# Patient Record
Sex: Female | Born: 1965 | Hispanic: Yes | Marital: Single
Health system: Southern US, Community
[De-identification: ages and names within clinical notes are randomized; demographics above are authoritative.]

## PROBLEM LIST (undated history)

## (undated) DIAGNOSIS — E039 Hypothyroidism, unspecified: Secondary | ICD-10-CM

## (undated) DIAGNOSIS — E119 Type 2 diabetes mellitus without complications: Secondary | ICD-10-CM

## (undated) DIAGNOSIS — I10 Essential (primary) hypertension: Secondary | ICD-10-CM

---

## 2000-04-26 ENCOUNTER — Other Ambulatory Visit: Admission: RE | Admit: 2000-04-26 | Discharge: 2000-04-26 | Payer: Self-pay | Admitting: Obstetrics

## 2000-04-26 ENCOUNTER — Encounter (INDEPENDENT_AMBULATORY_CARE_PROVIDER_SITE_OTHER): Payer: Self-pay | Admitting: Specialist

## 2000-07-19 ENCOUNTER — Encounter: Admission: RE | Admit: 2000-07-19 | Discharge: 2000-07-19 | Payer: Self-pay | Admitting: Obstetrics & Gynecology

## 2000-09-02 ENCOUNTER — Inpatient Hospital Stay (HOSPITAL_COMMUNITY): Admission: AD | Admit: 2000-09-02 | Discharge: 2000-09-02 | Payer: Self-pay | Admitting: *Deleted

## 2000-09-02 ENCOUNTER — Encounter: Payer: Self-pay | Admitting: Obstetrics & Gynecology

## 2000-09-27 ENCOUNTER — Ambulatory Visit (HOSPITAL_COMMUNITY): Admission: RE | Admit: 2000-09-27 | Discharge: 2000-09-27 | Payer: Self-pay | Admitting: *Deleted

## 2000-10-15 ENCOUNTER — Encounter: Admission: RE | Admit: 2000-10-15 | Discharge: 2000-10-15 | Payer: Self-pay | Admitting: Obstetrics & Gynecology

## 2000-12-04 ENCOUNTER — Ambulatory Visit (HOSPITAL_COMMUNITY): Admission: RE | Admit: 2000-12-04 | Discharge: 2000-12-04 | Payer: Self-pay | Admitting: *Deleted

## 2000-12-05 ENCOUNTER — Encounter: Admission: RE | Admit: 2000-12-05 | Discharge: 2000-12-05 | Payer: Self-pay | Admitting: Obstetrics

## 2001-01-20 ENCOUNTER — Ambulatory Visit (HOSPITAL_COMMUNITY): Admission: RE | Admit: 2001-01-20 | Discharge: 2001-01-20 | Payer: Self-pay | Admitting: Obstetrics

## 2001-02-01 ENCOUNTER — Observation Stay (HOSPITAL_COMMUNITY): Admission: AD | Admit: 2001-02-01 | Discharge: 2001-02-02 | Payer: Self-pay | Admitting: Obstetrics

## 2001-02-12 ENCOUNTER — Encounter: Payer: Self-pay | Admitting: Obstetrics

## 2001-02-12 ENCOUNTER — Inpatient Hospital Stay (HOSPITAL_COMMUNITY): Admission: AD | Admit: 2001-02-12 | Discharge: 2001-02-15 | Payer: Self-pay | Admitting: Obstetrics

## 2001-07-08 ENCOUNTER — Encounter: Admission: RE | Admit: 2001-07-08 | Discharge: 2001-07-08 | Payer: Self-pay | Admitting: *Deleted

## 2001-07-08 ENCOUNTER — Other Ambulatory Visit: Admission: RE | Admit: 2001-07-08 | Discharge: 2001-07-08 | Payer: Self-pay | Admitting: *Deleted

## 2001-07-29 ENCOUNTER — Encounter: Admission: RE | Admit: 2001-07-29 | Discharge: 2001-07-29 | Payer: Self-pay | Admitting: *Deleted

## 2002-03-31 ENCOUNTER — Encounter (INDEPENDENT_AMBULATORY_CARE_PROVIDER_SITE_OTHER): Payer: Self-pay | Admitting: Specialist

## 2002-03-31 ENCOUNTER — Encounter: Admission: RE | Admit: 2002-03-31 | Discharge: 2002-03-31 | Payer: Self-pay | Admitting: *Deleted

## 2002-04-06 ENCOUNTER — Ambulatory Visit (HOSPITAL_COMMUNITY): Admission: RE | Admit: 2002-04-06 | Discharge: 2002-04-06 | Payer: Self-pay | Admitting: Radiology

## 2002-08-21 ENCOUNTER — Other Ambulatory Visit: Admission: RE | Admit: 2002-08-21 | Discharge: 2002-08-21 | Payer: Self-pay | Admitting: Family Medicine

## 2002-08-21 ENCOUNTER — Encounter (INDEPENDENT_AMBULATORY_CARE_PROVIDER_SITE_OTHER): Payer: Self-pay | Admitting: Specialist

## 2002-08-21 ENCOUNTER — Encounter: Admission: RE | Admit: 2002-08-21 | Discharge: 2002-08-21 | Payer: Self-pay | Admitting: Family Medicine

## 2002-10-14 ENCOUNTER — Ambulatory Visit: Admission: RE | Admit: 2002-10-14 | Discharge: 2002-10-14 | Payer: Self-pay | Admitting: Gynecology

## 2004-05-09 ENCOUNTER — Ambulatory Visit: Payer: Self-pay | Admitting: Family Medicine

## 2004-05-26 ENCOUNTER — Ambulatory Visit: Payer: Self-pay | Admitting: Family Medicine

## 2004-06-28 DIAGNOSIS — R87619 Unspecified abnormal cytological findings in specimens from cervix uteri: Secondary | ICD-10-CM | POA: Insufficient documentation

## 2004-06-29 ENCOUNTER — Ambulatory Visit: Payer: Self-pay | Admitting: *Deleted

## 2004-06-29 ENCOUNTER — Encounter (INDEPENDENT_AMBULATORY_CARE_PROVIDER_SITE_OTHER): Payer: Self-pay | Admitting: *Deleted

## 2004-06-29 ENCOUNTER — Ambulatory Visit: Payer: Self-pay | Admitting: Family Medicine

## 2004-07-11 ENCOUNTER — Ambulatory Visit (HOSPITAL_COMMUNITY): Admission: RE | Admit: 2004-07-11 | Discharge: 2004-07-11 | Payer: Self-pay | Admitting: Family Medicine

## 2004-07-13 ENCOUNTER — Ambulatory Visit: Payer: Self-pay | Admitting: Family Medicine

## 2004-07-25 ENCOUNTER — Ambulatory Visit: Payer: Self-pay | Admitting: Family Medicine

## 2004-08-11 ENCOUNTER — Ambulatory Visit: Payer: Self-pay | Admitting: Family Medicine

## 2004-08-17 ENCOUNTER — Ambulatory Visit: Payer: Self-pay | Admitting: Family Medicine

## 2004-09-01 ENCOUNTER — Ambulatory Visit: Payer: Self-pay | Admitting: Family Medicine

## 2004-09-04 ENCOUNTER — Ambulatory Visit: Payer: Self-pay | Admitting: Family Medicine

## 2004-09-04 ENCOUNTER — Ambulatory Visit (HOSPITAL_COMMUNITY): Admission: RE | Admit: 2004-09-04 | Discharge: 2004-09-04 | Payer: Self-pay | Admitting: Family Medicine

## 2004-09-04 ENCOUNTER — Encounter (INDEPENDENT_AMBULATORY_CARE_PROVIDER_SITE_OTHER): Payer: Self-pay | Admitting: Specialist

## 2004-09-21 ENCOUNTER — Ambulatory Visit: Payer: Self-pay | Admitting: Obstetrics and Gynecology

## 2004-09-28 ENCOUNTER — Ambulatory Visit: Payer: Self-pay | Admitting: Internal Medicine

## 2004-10-13 ENCOUNTER — Ambulatory Visit: Payer: Self-pay | Admitting: Family Medicine

## 2005-01-12 ENCOUNTER — Ambulatory Visit: Payer: Self-pay | Admitting: Family Medicine

## 2005-01-25 ENCOUNTER — Ambulatory Visit: Payer: Self-pay | Admitting: Obstetrics and Gynecology

## 2005-04-11 ENCOUNTER — Ambulatory Visit: Payer: Self-pay | Admitting: Family Medicine

## 2005-04-18 ENCOUNTER — Ambulatory Visit: Payer: Self-pay | Admitting: Internal Medicine

## 2005-04-23 ENCOUNTER — Ambulatory Visit: Payer: Self-pay | Admitting: Family Medicine

## 2005-08-08 ENCOUNTER — Ambulatory Visit: Payer: Self-pay | Admitting: Family Medicine

## 2005-08-09 ENCOUNTER — Ambulatory Visit (HOSPITAL_COMMUNITY): Admission: RE | Admit: 2005-08-09 | Discharge: 2005-08-09 | Payer: Self-pay | Admitting: Family Medicine

## 2005-08-09 LAB — CONVERTED CEMR LAB
AST: 20 units/L
Albumin: 4.5 g/dL
BUN: 10 mg/dL
Calcium: 9.9 mg/dL
Sodium: 138 meq/L
Total Bilirubin: 0.4 mg/dL
Total Protein: 8.2 g/dL

## 2005-09-05 ENCOUNTER — Encounter (INDEPENDENT_AMBULATORY_CARE_PROVIDER_SITE_OTHER): Payer: Self-pay | Admitting: *Deleted

## 2005-09-05 ENCOUNTER — Ambulatory Visit: Payer: Self-pay | Admitting: Obstetrics and Gynecology

## 2005-11-08 ENCOUNTER — Ambulatory Visit: Payer: Self-pay | Admitting: Family Medicine

## 2005-12-13 ENCOUNTER — Ambulatory Visit: Payer: Self-pay | Admitting: Family Medicine

## 2006-03-06 ENCOUNTER — Encounter (INDEPENDENT_AMBULATORY_CARE_PROVIDER_SITE_OTHER): Payer: Self-pay | Admitting: *Deleted

## 2006-03-06 ENCOUNTER — Ambulatory Visit: Payer: Self-pay | Admitting: Obstetrics and Gynecology

## 2006-04-15 ENCOUNTER — Ambulatory Visit: Payer: Self-pay | Admitting: Internal Medicine

## 2006-05-23 ENCOUNTER — Ambulatory Visit: Payer: Self-pay | Admitting: Internal Medicine

## 2006-06-06 ENCOUNTER — Ambulatory Visit: Payer: Self-pay | Admitting: Family Medicine

## 2006-08-26 ENCOUNTER — Ambulatory Visit: Payer: Self-pay | Admitting: Internal Medicine

## 2006-09-02 ENCOUNTER — Ambulatory Visit (HOSPITAL_COMMUNITY): Admission: RE | Admit: 2006-09-02 | Discharge: 2006-09-02 | Payer: Self-pay | Admitting: Internal Medicine

## 2006-09-23 ENCOUNTER — Ambulatory Visit: Payer: Self-pay | Admitting: Family Medicine

## 2007-02-12 ENCOUNTER — Encounter (INDEPENDENT_AMBULATORY_CARE_PROVIDER_SITE_OTHER): Payer: Self-pay | Admitting: *Deleted

## 2007-03-13 ENCOUNTER — Telehealth (INDEPENDENT_AMBULATORY_CARE_PROVIDER_SITE_OTHER): Payer: Self-pay | Admitting: *Deleted

## 2007-03-27 DIAGNOSIS — I10 Essential (primary) hypertension: Secondary | ICD-10-CM | POA: Insufficient documentation

## 2007-03-27 DIAGNOSIS — F329 Major depressive disorder, single episode, unspecified: Secondary | ICD-10-CM

## 2007-03-27 DIAGNOSIS — F3289 Other specified depressive episodes: Secondary | ICD-10-CM | POA: Insufficient documentation

## 2007-04-09 ENCOUNTER — Ambulatory Visit: Payer: Self-pay | Admitting: Nurse Practitioner

## 2007-04-09 DIAGNOSIS — E785 Hyperlipidemia, unspecified: Secondary | ICD-10-CM

## 2007-04-09 DIAGNOSIS — K219 Gastro-esophageal reflux disease without esophagitis: Secondary | ICD-10-CM

## 2007-04-09 LAB — CONVERTED CEMR LAB
ALT: 14 units/L (ref 0–35)
Alkaline Phosphatase: 68 units/L (ref 39–117)
Basophils Relative: 0 % (ref 0–1)
Creatinine, Ser: 0.59 mg/dL (ref 0.40–1.20)
Glucose, Bld: 78 mg/dL (ref 70–99)
HCT: 42.8 % (ref 36.0–46.0)
Hemoglobin: 14 g/dL (ref 12.0–15.0)
MCHC: 32.7 g/dL (ref 30.0–36.0)
Microalb, Ur: 11.1 mg/dL — ABNORMAL HIGH (ref 0.00–1.89)
Neutrophils Relative %: 50 % (ref 43–77)
Potassium: 4.4 meq/L (ref 3.5–5.3)
RBC: 4.97 M/uL (ref 3.87–5.11)
Total Bilirubin: 0.6 mg/dL (ref 0.3–1.2)
Total Protein: 8.7 g/dL — ABNORMAL HIGH (ref 6.0–8.3)
WBC: 6.6 10*3/uL (ref 4.0–10.5)

## 2007-04-10 ENCOUNTER — Encounter (INDEPENDENT_AMBULATORY_CARE_PROVIDER_SITE_OTHER): Payer: Self-pay | Admitting: Nurse Practitioner

## 2007-04-10 ENCOUNTER — Telehealth (INDEPENDENT_AMBULATORY_CARE_PROVIDER_SITE_OTHER): Payer: Self-pay | Admitting: *Deleted

## 2007-04-10 LAB — CONVERTED CEMR LAB
Free T4: 0.82 ng/dL — ABNORMAL LOW (ref 0.89–1.80)
T3, Free: 3 pg/mL (ref 2.3–4.2)

## 2007-04-14 ENCOUNTER — Encounter (INDEPENDENT_AMBULATORY_CARE_PROVIDER_SITE_OTHER): Payer: Self-pay | Admitting: Nurse Practitioner

## 2007-05-15 ENCOUNTER — Encounter (INDEPENDENT_AMBULATORY_CARE_PROVIDER_SITE_OTHER): Payer: Self-pay | Admitting: *Deleted

## 2007-05-15 ENCOUNTER — Ambulatory Visit: Payer: Self-pay | Admitting: Family Medicine

## 2007-05-15 ENCOUNTER — Encounter (INDEPENDENT_AMBULATORY_CARE_PROVIDER_SITE_OTHER): Payer: Self-pay | Admitting: Nurse Practitioner

## 2007-05-15 DIAGNOSIS — E039 Hypothyroidism, unspecified: Secondary | ICD-10-CM | POA: Insufficient documentation

## 2007-05-15 LAB — CONVERTED CEMR LAB
Cholesterol: 180 mg/dL (ref 0–200)
HDL: 45 mg/dL (ref >39)
LDL Cholesterol: 97 mg/dL (ref 0–99)
VLDL: 38 mg/dL (ref 0–40)

## 2007-05-16 ENCOUNTER — Encounter (INDEPENDENT_AMBULATORY_CARE_PROVIDER_SITE_OTHER): Payer: Self-pay | Admitting: Nurse Practitioner

## 2007-06-26 ENCOUNTER — Encounter (INDEPENDENT_AMBULATORY_CARE_PROVIDER_SITE_OTHER): Payer: Self-pay | Admitting: Nurse Practitioner

## 2007-06-26 ENCOUNTER — Ambulatory Visit: Payer: Self-pay | Admitting: Family Medicine

## 2007-06-26 LAB — CONVERTED CEMR LAB: TSH: 3.238 microintl units/mL (ref 0.350–5.50)

## 2007-06-27 ENCOUNTER — Encounter (INDEPENDENT_AMBULATORY_CARE_PROVIDER_SITE_OTHER): Payer: Self-pay | Admitting: Nurse Practitioner

## 2007-07-22 ENCOUNTER — Encounter (INDEPENDENT_AMBULATORY_CARE_PROVIDER_SITE_OTHER): Payer: Self-pay | Admitting: Family Medicine

## 2007-07-22 ENCOUNTER — Ambulatory Visit: Payer: Self-pay | Admitting: Family Medicine

## 2007-07-22 DIAGNOSIS — J069 Acute upper respiratory infection, unspecified: Secondary | ICD-10-CM | POA: Insufficient documentation

## 2007-07-22 LAB — CONVERTED CEMR LAB
Specific Gravity, Urine: <1.005
Urobilinogen, UA: 0.2
pH: 6.5

## 2007-07-23 ENCOUNTER — Encounter (INDEPENDENT_AMBULATORY_CARE_PROVIDER_SITE_OTHER): Payer: Self-pay | Admitting: Family Medicine

## 2007-07-23 LAB — CONVERTED CEMR LAB
Chlamydia, DNA Probe: NEGATIVE
GC Probe Amp, Genital: NEGATIVE

## 2007-09-07 IMAGING — MG MM DIGITAL SCREENING BILAT W/ CAD
5 series · 5 of 5 positions shown · non-contrast
Comparison: none

SCREENING MAMMOGRAM:
There is a  dense fibroglandular pattern.  No masses or malignant type calcifications are 
identified.  Compared with prior studies.

[R CC]
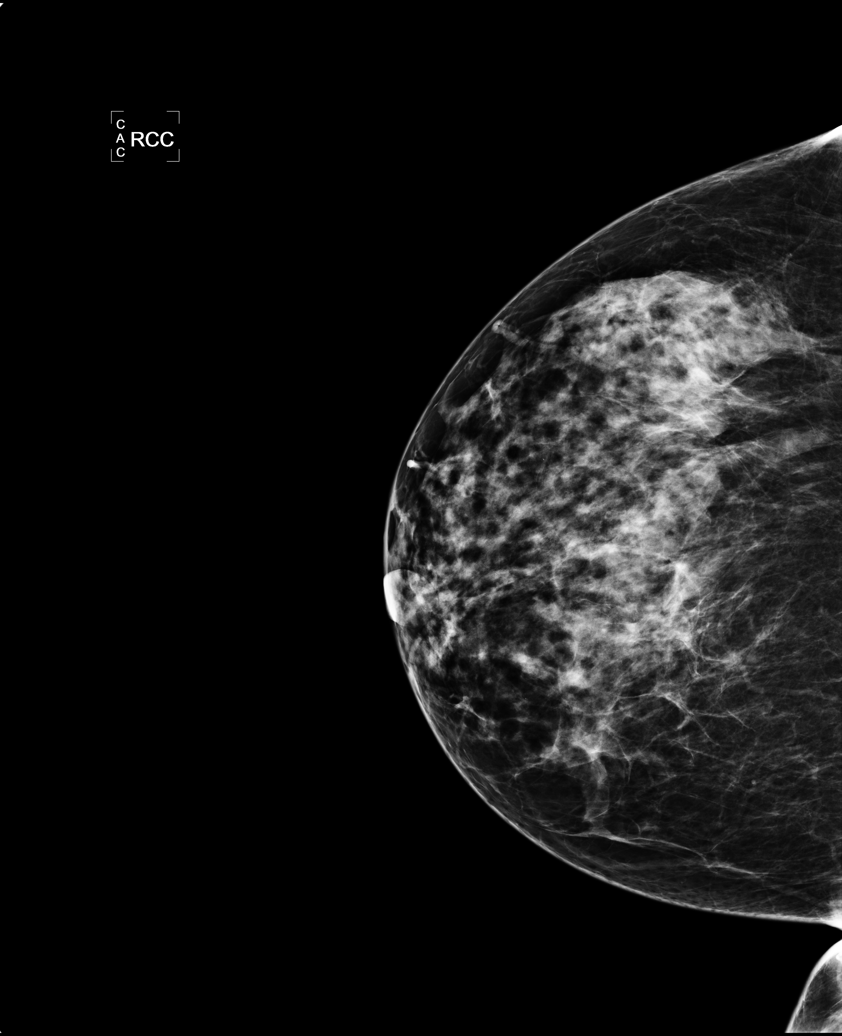

[R MLO]
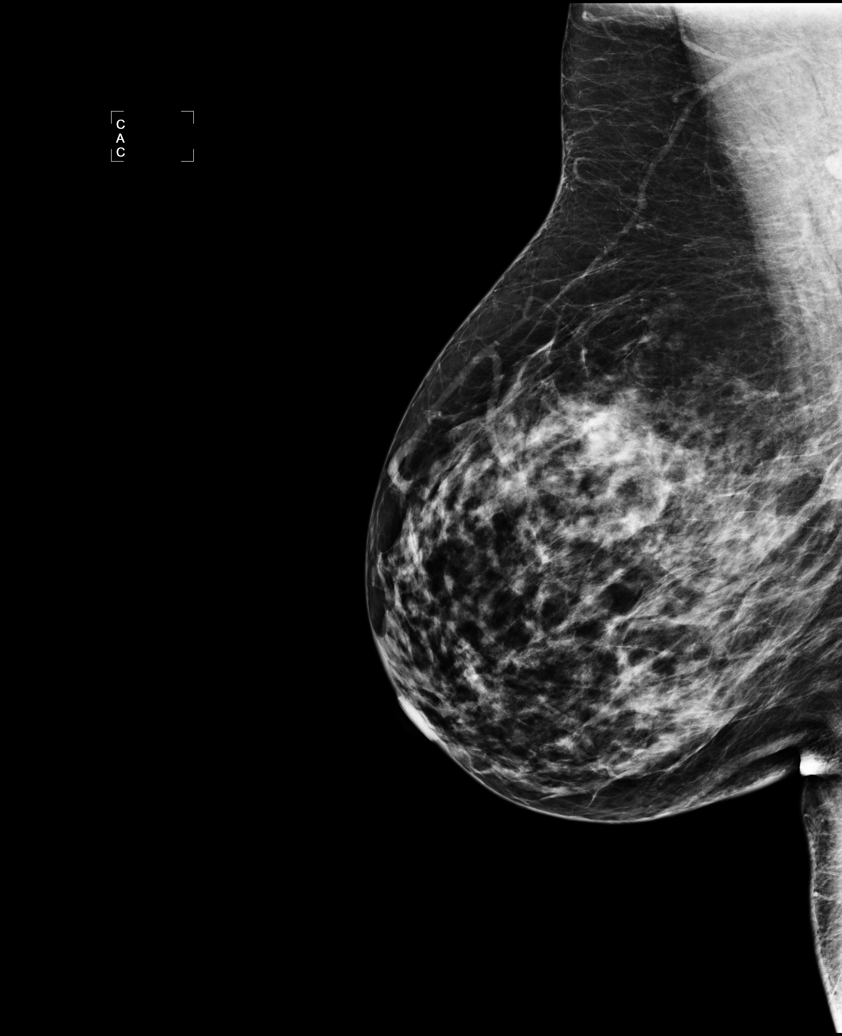

[L CC]
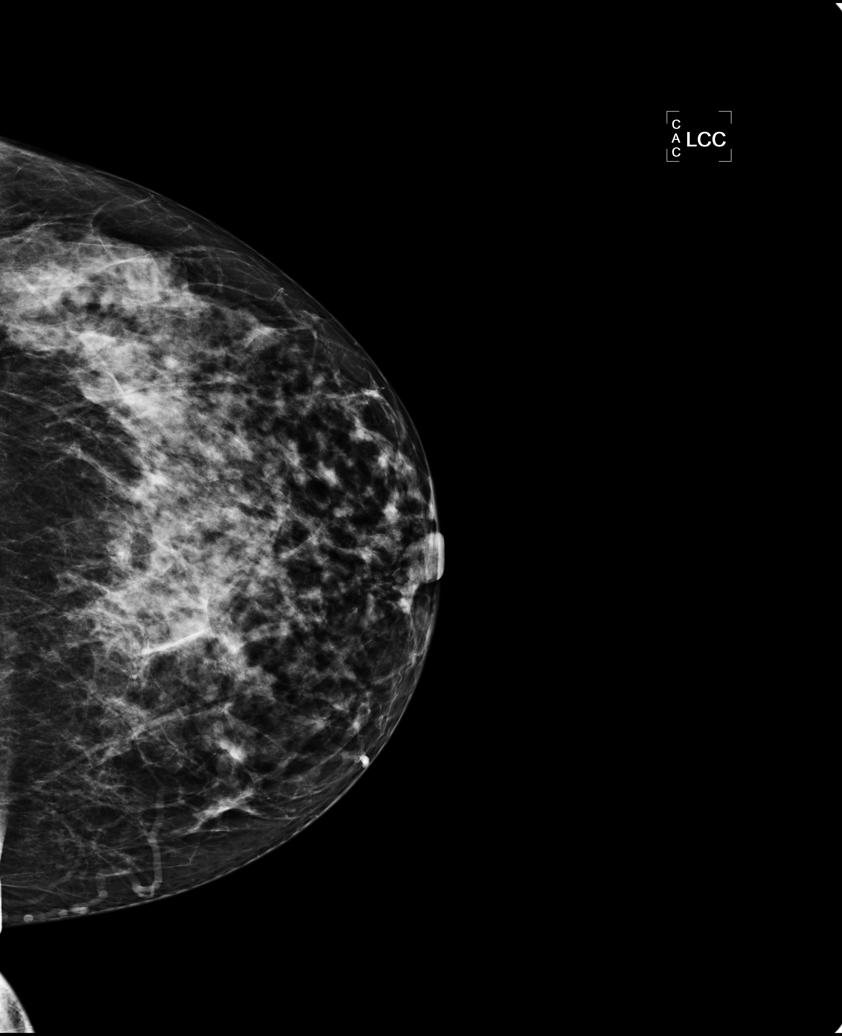

[L MLO (1 of 2)]
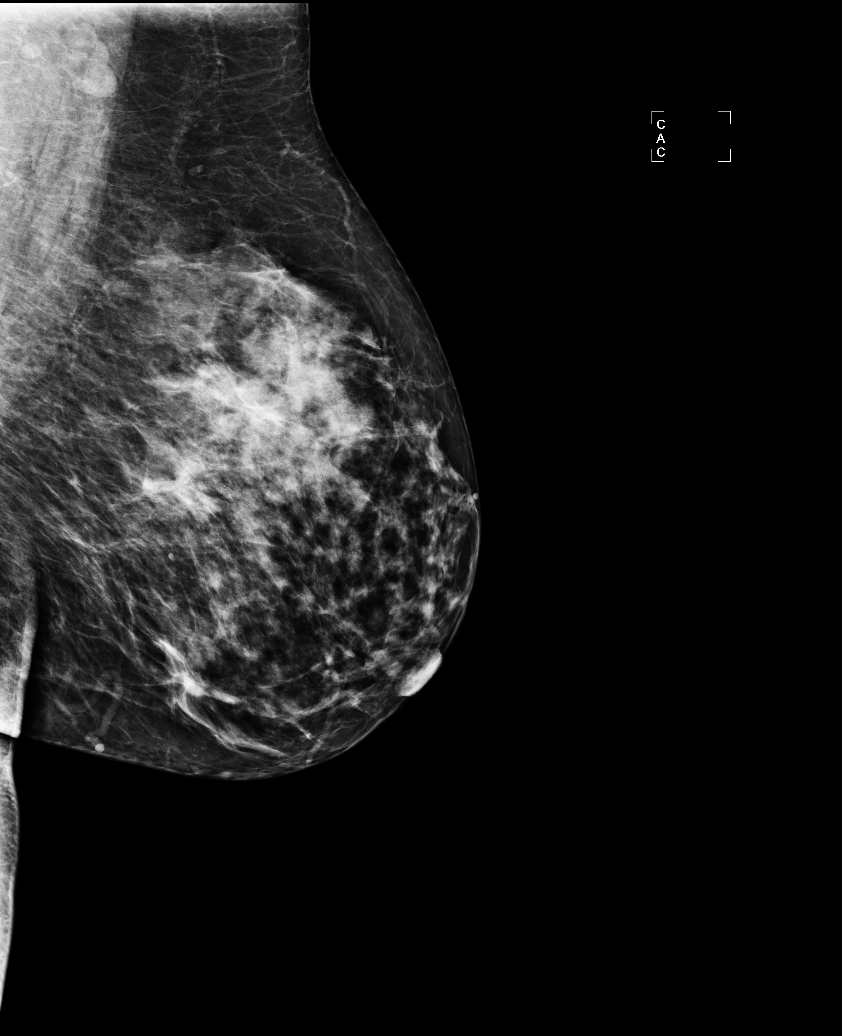

[L MLO (2 of 2)]
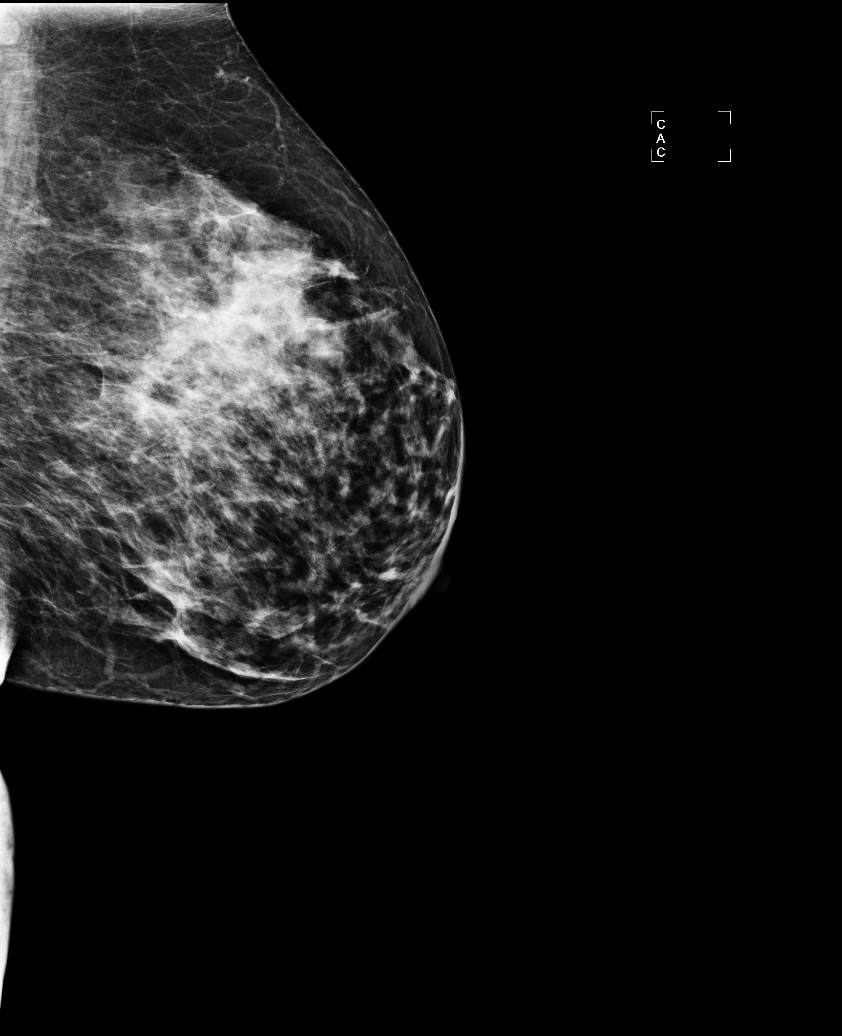

[5 of 5 positions shown; findings below may reference images not displayed]

IMPRESSION: No specific mammographic evidence of malignancy.  Next screening mammogram is recommended in one 
year.

ASSESSMENT: Negative - BI-RADS 1

Screening mammogram in 1 year.

## 2007-10-06 ENCOUNTER — Telehealth (INDEPENDENT_AMBULATORY_CARE_PROVIDER_SITE_OTHER): Payer: Self-pay | Admitting: *Deleted

## 2007-10-29 ENCOUNTER — Ambulatory Visit: Payer: Self-pay | Admitting: Family Medicine

## 2007-10-29 DIAGNOSIS — B353 Tinea pedis: Secondary | ICD-10-CM | POA: Insufficient documentation

## 2008-03-03 ENCOUNTER — Ambulatory Visit: Payer: Self-pay | Admitting: Family Medicine

## 2008-03-03 DIAGNOSIS — R05 Cough: Secondary | ICD-10-CM

## 2008-03-03 LAB — CONVERTED CEMR LAB
Ketones, urine, test strip: NEGATIVE
Nitrite: NEGATIVE
Specific Gravity, Urine: 1.015
TSH: 4.036 microintl units/mL (ref 0.350–4.50)

## 2008-04-08 ENCOUNTER — Telehealth (INDEPENDENT_AMBULATORY_CARE_PROVIDER_SITE_OTHER): Payer: Self-pay | Admitting: *Deleted

## 2008-07-28 ENCOUNTER — Ambulatory Visit: Payer: Self-pay | Admitting: Family Medicine

## 2008-07-28 DIAGNOSIS — IMO0002 Reserved for concepts with insufficient information to code with codable children: Secondary | ICD-10-CM

## 2008-07-28 DIAGNOSIS — H60339 Swimmer's ear, unspecified ear: Secondary | ICD-10-CM

## 2008-08-06 ENCOUNTER — Ambulatory Visit (HOSPITAL_COMMUNITY): Admission: RE | Admit: 2008-08-06 | Discharge: 2008-08-06 | Payer: Self-pay | Admitting: Family Medicine

## 2008-09-06 ENCOUNTER — Ambulatory Visit: Payer: Self-pay | Admitting: Family Medicine

## 2008-09-06 ENCOUNTER — Encounter (INDEPENDENT_AMBULATORY_CARE_PROVIDER_SITE_OTHER): Payer: Self-pay | Admitting: Family Medicine

## 2008-09-06 DIAGNOSIS — J301 Allergic rhinitis due to pollen: Secondary | ICD-10-CM | POA: Insufficient documentation

## 2008-09-06 LAB — CONVERTED CEMR LAB
ALT: 13 units/L (ref 0–35)
AST: 14 units/L (ref 0–37)
Albumin: 4.1 g/dL (ref 3.5–5.2)
CO2: 19 meq/L (ref 19–32)
Calcium: 9 mg/dL (ref 8.4–10.5)
Chloride: 106 meq/L (ref 96–112)
Creatinine, Ser: 0.53 mg/dL (ref 0.40–1.20)
GC Probe Amp, Genital: NEGATIVE
Glucose, Bld: 76 mg/dL (ref 70–99)
Glucose, Urine, Semiquant: NEGATIVE
Ketones, urine, test strip: NEGATIVE
Protein, U semiquant: 30
Total Bilirubin: 0.3 mg/dL (ref 0.3–1.2)
pH: 5.5

## 2008-09-13 ENCOUNTER — Ambulatory Visit: Payer: Self-pay | Admitting: Family Medicine

## 2008-09-13 LAB — CONVERTED CEMR LAB
Cholesterol: 187 mg/dL (ref 0–200)
LDL Cholesterol: 112 mg/dL — ABNORMAL HIGH (ref 0–99)
Triglycerides: 164 mg/dL — ABNORMAL HIGH (ref <150)
VLDL: 33 mg/dL (ref 0–40)

## 2008-10-21 ENCOUNTER — Emergency Department (HOSPITAL_COMMUNITY): Admission: EM | Admit: 2008-10-21 | Discharge: 2008-10-22 | Payer: Self-pay | Admitting: Emergency Medicine

## 2008-10-27 ENCOUNTER — Encounter (INDEPENDENT_AMBULATORY_CARE_PROVIDER_SITE_OTHER): Payer: Self-pay | Admitting: Family Medicine

## 2008-11-11 ENCOUNTER — Ambulatory Visit: Payer: Self-pay | Admitting: Nurse Practitioner

## 2008-11-11 DIAGNOSIS — R350 Frequency of micturition: Secondary | ICD-10-CM

## 2008-11-11 DIAGNOSIS — R3129 Other microscopic hematuria: Secondary | ICD-10-CM

## 2008-11-11 LAB — CONVERTED CEMR LAB
Beta hcg, urine, semiquantitative: NEGATIVE
Blood Glucose, Fingerstick: 81
Glucose, Urine, Semiquant: NEGATIVE
Ketones, urine, test strip: NEGATIVE
WBC Urine, dipstick: NEGATIVE

## 2008-11-12 ENCOUNTER — Encounter (INDEPENDENT_AMBULATORY_CARE_PROVIDER_SITE_OTHER): Payer: Self-pay | Admitting: Nurse Practitioner

## 2008-11-23 ENCOUNTER — Telehealth (INDEPENDENT_AMBULATORY_CARE_PROVIDER_SITE_OTHER): Payer: Self-pay | Admitting: Family Medicine

## 2009-01-05 ENCOUNTER — Ambulatory Visit: Payer: Self-pay | Admitting: Physician Assistant

## 2009-01-05 DIAGNOSIS — L293 Anogenital pruritus, unspecified: Secondary | ICD-10-CM | POA: Insufficient documentation

## 2009-01-05 LAB — CONVERTED CEMR LAB: Chlamydia, DNA Probe: NEGATIVE

## 2009-01-06 ENCOUNTER — Encounter: Payer: Self-pay | Admitting: Physician Assistant

## 2009-01-10 ENCOUNTER — Telehealth: Payer: Self-pay | Admitting: Physician Assistant

## 2009-01-20 ENCOUNTER — Telehealth: Payer: Self-pay | Admitting: Physician Assistant

## 2009-02-01 ENCOUNTER — Telehealth: Payer: Self-pay | Admitting: Physician Assistant

## 2009-02-07 ENCOUNTER — Ambulatory Visit: Payer: Self-pay | Admitting: Physician Assistant

## 2009-02-07 DIAGNOSIS — M722 Plantar fascial fibromatosis: Secondary | ICD-10-CM

## 2009-03-31 ENCOUNTER — Ambulatory Visit: Payer: Self-pay | Admitting: Physician Assistant

## 2009-03-31 DIAGNOSIS — N912 Amenorrhea, unspecified: Secondary | ICD-10-CM

## 2009-03-31 LAB — CONVERTED CEMR LAB
BUN: 11 mg/dL (ref 6–23)
Basophils Absolute: 0 10*3/uL (ref 0.0–0.1)
Basophils Relative: 0 % (ref 0–1)
CO2: 22 meq/L (ref 19–32)
Creatinine, Ser: 0.59 mg/dL (ref 0.40–1.20)
Glucose, Bld: 81 mg/dL (ref 70–99)
HCT: 41.7 % (ref 36.0–46.0)
Hemoglobin: 13.9 g/dL (ref 12.0–15.0)
Lymphocytes Relative: 38 % (ref 12–46)
MCV: 86.5 fL (ref 78.0–100.0)
Neutrophils Relative %: 55 % (ref 43–77)
Platelets: 346 10*3/uL (ref 150–400)
Sodium: 137 meq/L (ref 135–145)
WBC: 6.5 10*3/uL (ref 4.0–10.5)

## 2009-04-01 ENCOUNTER — Encounter: Payer: Self-pay | Admitting: Physician Assistant

## 2009-04-08 ENCOUNTER — Encounter: Payer: Self-pay | Admitting: Physician Assistant

## 2009-04-29 ENCOUNTER — Ambulatory Visit: Payer: Self-pay | Admitting: Physician Assistant

## 2009-04-29 ENCOUNTER — Telehealth (INDEPENDENT_AMBULATORY_CARE_PROVIDER_SITE_OTHER): Payer: Self-pay | Admitting: Internal Medicine

## 2009-04-29 ENCOUNTER — Ambulatory Visit: Payer: Self-pay | Admitting: Internal Medicine

## 2009-04-29 DIAGNOSIS — J029 Acute pharyngitis, unspecified: Secondary | ICD-10-CM

## 2009-05-31 LAB — CONVERTED CEMR LAB

## 2009-07-01 ENCOUNTER — Ambulatory Visit: Payer: Self-pay | Admitting: Physician Assistant

## 2009-07-04 LAB — CONVERTED CEMR LAB
Eosinophils Absolute: 0 10*3/uL (ref 0.0–0.7)
Eosinophils Relative: 1 % (ref 0–5)
Hemoglobin: 13 g/dL (ref 12.0–15.0)
Lymphocytes Relative: 37 % (ref 12–46)
Lymphs Abs: 2.9 10*3/uL (ref 0.7–4.0)
MCHC: 32.8 g/dL (ref 30.0–36.0)
MCV: 86.5 fL (ref 78.0–100.0)
Monocytes Absolute: 0.5 10*3/uL (ref 0.1–1.0)
Monocytes Relative: 6 % (ref 3–12)
Neutro Abs: 4.3 10*3/uL (ref 1.7–7.7)
Neutrophils Relative %: 56 % (ref 43–77)
Platelets: 329 10*3/uL (ref 150–400)

## 2009-07-25 ENCOUNTER — Ambulatory Visit: Payer: Self-pay | Admitting: Nurse Practitioner

## 2009-07-25 LAB — CONVERTED CEMR LAB: Rapid Strep: NEGATIVE

## 2009-08-12 ENCOUNTER — Ambulatory Visit: Payer: Self-pay | Admitting: Physician Assistant

## 2009-08-12 LAB — CONVERTED CEMR LAB
Calcium: 9 mg/dL (ref 8.4–10.5)
Chloride: 101 meq/L (ref 96–112)
Glucose, Bld: 81 mg/dL (ref 70–99)

## 2009-08-13 ENCOUNTER — Encounter: Payer: Self-pay | Admitting: Physician Assistant

## 2009-08-19 ENCOUNTER — Ambulatory Visit (HOSPITAL_COMMUNITY): Admission: RE | Admit: 2009-08-19 | Discharge: 2009-08-19 | Payer: Self-pay | Admitting: Internal Medicine

## 2009-10-14 ENCOUNTER — Ambulatory Visit: Payer: Self-pay | Admitting: Physician Assistant

## 2009-10-14 DIAGNOSIS — K120 Recurrent oral aphthae: Secondary | ICD-10-CM

## 2009-10-14 DIAGNOSIS — R82998 Other abnormal findings in urine: Secondary | ICD-10-CM

## 2009-10-14 LAB — CONVERTED CEMR LAB
Bilirubin Urine: NEGATIVE
Glucose, Urine, Semiquant: NEGATIVE
Nitrite: NEGATIVE
Pap Smear: NEGATIVE
Urobilinogen, UA: 0.2

## 2009-10-16 LAB — CONVERTED CEMR LAB
ALT: 14 units/L (ref 0–35)
AST: 14 units/L (ref 0–37)
Albumin: 4.5 g/dL (ref 3.5–5.2)
Bacteria, UA: NONE SEEN
Chlamydia, DNA Probe: NEGATIVE
Chloride: 103 meq/L (ref 96–112)
GC Probe Amp, Genital: NEGATIVE
Glucose, Bld: 95 mg/dL (ref 70–99)
Potassium: 4.2 meq/L (ref 3.5–5.3)
RBC / HPF: NONE SEEN (ref <3)
Total Bilirubin: 0.5 mg/dL (ref 0.3–1.2)
VLDL: 30 mg/dL (ref 0–40)
WBC, UA: NONE SEEN cells/hpf (ref <3)

## 2009-10-18 ENCOUNTER — Encounter: Payer: Self-pay | Admitting: Physician Assistant

## 2009-10-25 ENCOUNTER — Ambulatory Visit: Payer: Self-pay | Admitting: Physician Assistant

## 2009-10-31 ENCOUNTER — Emergency Department (HOSPITAL_COMMUNITY): Admission: EM | Admit: 2009-10-31 | Discharge: 2009-10-31 | Payer: Self-pay | Admitting: Emergency Medicine

## 2009-11-01 ENCOUNTER — Telehealth (INDEPENDENT_AMBULATORY_CARE_PROVIDER_SITE_OTHER): Payer: Self-pay | Admitting: *Deleted

## 2010-02-10 ENCOUNTER — Ambulatory Visit: Payer: Self-pay | Admitting: Internal Medicine

## 2010-02-10 DIAGNOSIS — H6980 Other specified disorders of Eustachian tube, unspecified ear: Secondary | ICD-10-CM

## 2010-02-10 DIAGNOSIS — H6990 Unspecified Eustachian tube disorder, unspecified ear: Secondary | ICD-10-CM | POA: Insufficient documentation

## 2010-02-14 ENCOUNTER — Ambulatory Visit: Payer: Self-pay | Admitting: Physician Assistant

## 2010-02-14 DIAGNOSIS — R300 Dysuria: Secondary | ICD-10-CM | POA: Insufficient documentation

## 2010-02-14 DIAGNOSIS — N920 Excessive and frequent menstruation with regular cycle: Secondary | ICD-10-CM

## 2010-02-14 LAB — CONVERTED CEMR LAB
Ketones, urine, test strip: NEGATIVE
Nitrite: NEGATIVE
Protein, U semiquant: NEGATIVE
Urobilinogen, UA: 0.2
WBC Urine, dipstick: NEGATIVE

## 2010-02-15 ENCOUNTER — Encounter: Payer: Self-pay | Admitting: Physician Assistant

## 2010-02-16 LAB — CONVERTED CEMR LAB
GC Probe Amp, Genital: NEGATIVE
Squamous Epithelial / LPF: NONE SEEN /lpf
TSH: 2.881 microintl units/mL (ref 0.350–4.500)

## 2010-03-10 ENCOUNTER — Ambulatory Visit: Payer: Self-pay | Admitting: Physician Assistant

## 2010-03-10 DIAGNOSIS — E559 Vitamin D deficiency, unspecified: Secondary | ICD-10-CM | POA: Insufficient documentation

## 2010-03-13 LAB — CONVERTED CEMR LAB: Vit D, 25-Hydroxy: 25 ng/mL — ABNORMAL LOW (ref 30–89)

## 2010-04-06 ENCOUNTER — Ambulatory Visit: Payer: Self-pay | Admitting: Internal Medicine

## 2010-04-06 DIAGNOSIS — L0292 Furuncle, unspecified: Secondary | ICD-10-CM | POA: Insufficient documentation

## 2010-04-06 DIAGNOSIS — L0293 Carbuncle, unspecified: Secondary | ICD-10-CM

## 2010-06-02 ENCOUNTER — Ambulatory Visit
Admission: RE | Admit: 2010-06-02 | Discharge: 2010-06-02 | Payer: Self-pay | Source: Home / Self Care | Attending: Internal Medicine | Admitting: Internal Medicine

## 2010-06-02 DIAGNOSIS — G5600 Carpal tunnel syndrome, unspecified upper limb: Secondary | ICD-10-CM | POA: Insufficient documentation

## 2010-06-02 DIAGNOSIS — R071 Chest pain on breathing: Secondary | ICD-10-CM | POA: Insufficient documentation

## 2010-06-18 ENCOUNTER — Encounter: Payer: Self-pay | Admitting: Family Medicine

## 2010-06-27 NOTE — Assessment & Plan Note (Signed)
Summary: CPP EXAM///GK   Vital Signs:  Patient profile:   45 year old female Height:      61.5 inches Weight:      197 pounds BMI:     36.75 Temp:     97.6 degrees F oral Pulse rate:   64 / minute Pulse rhythm:   regular Resp:     18 per minute BP sitting:   127 / 78  (left arm) Cuff size:   large  Vitals Entered By: [place] Shannon (Oct 14, 2009 9:31 AM) CC: cpp... pt is complain of sore throat...  Does patient need assistance? Functional Status Self care Ambulation Normal   Primary Care Provider:  Tereso Newcomer PA-C  CC:  cpp... pt is complain of sore throat....  History of Present Illness: Here for CPP.  Health maint: LMP 5.3.2011 + h/o abnormal pap smear with conization No h/o abnormal bleeding or discharge or odor. Mammo done this year. No FHx of breast cancer. Not taking calcium. PHQ9=8 today.  Used to take antidepressants in past.  Not interested in taking again.  Would like to do counseling.  Sore throat: Notes for 3 weeks.  Has not taken anything for it.  Always hurts.  Nothing makes worse.  Nothing makes better.  No fevers.  No chills.  No cough.  No head congestion or cold symptoms.  Does think she feels postnasal drip.  GERD:  Doing much better.  Denies stomach pain.  No dysphagia.  No melena or hematochezia.  No belching or indigestion.   Problems Prior to Update: 1)  Aphthous Ulcers  (ICD-528.2) 2)  Urinalysis, Abnormal  (ICD-791.9) 3)  Sore Throat  (ICD-462) 4)  Amenorrhea  (ICD-626.0) 5)  Plantar Fasciitis, Right  (ICD-728.71) 6)  Vaginal Pruritus  (ICD-698.1) 7)  Frequency, Urinary  (ICD-788.41) 8)  Microscopic Hematuria  (ICD-599.72) 9)  Allergic Rhinitis, Seasonal  (ICD-477.0) 10)  Screening For Malignant Neoplasm, Cervix  (ICD-V76.2) 11)  Muscle Strain, Right Pectoralis Muscle  (ICD-848.8) 12)  Screening For Mlig Neop, Breast, Nos  (ICD-V76.10) 13)  Otitis Externa, Acute, Right  (ICD-380.12) 14)  Cough  (ICD-786.2) 15)  Tinea Pedis   (ICD-110.4) 16)  Uri  (ICD-465.9) 17)  Hyperlipidemia  (ICD-272.4) 18)  Screening For Malignant Neoplasm, Cervix  (ICD-V76.2) 19)  Examination, Routine Medical  (ICD-V70.0) 20)  Contraceptive Management  (ICD-V25.09) 21)  Hypothyroidism  (ICD-244.9) 22)  Pap Smear, Abnormal  (ICD-795.00) 23)  Dyslipidemia  (ICD-272.4) 24)  Gerd  (ICD-530.81) 25)  Depression  (ICD-311) 26)  Hypertension  (ICD-401.9)  Allergies: No Known Drug Allergies  Past History:  Past Surgical History: Last updated: 07/22/2007 h/o abnormal PAP and cervical conization 09/2004  Past Medical History: Depression Hyperlipidemia Hypertension Hypothyroidism h/o abnormal pap with conization 2008  Family History: Reviewed history from 07/22/2007 and no changes required. Mother died age 14 Fire. Had HTN,DM,high cholesterol. Father living DM  No breast/ovarian/uterine/cervical/or colon cancer.  Social History: Occupation:unemployed Has one partnerx8 years. Never Smoked Alcohol use-no Drug use-no Married  Review of Systems      See HPI General:  Denies chills and fever. ENT:  See HPI. CV:  Denies chest pain or discomfort and shortness of breath with exertion. Resp:  Denies cough. GI:  Denies bloody stools and dark tarry stools. GU:  Denies hematuria. MS:  "my bones hurt". Psych:  Denies suicidal thoughts/plans.  Physical Exam  General:  alert, well-developed, and well-nourished.   Head:  normocephalic and atraumatic.   Eyes:  pupils equal, pupils  round, pupils reactive to light, and no optic disk abnormalities.   Ears:  R ear normal and L ear normal.   Nose:  no external deformity.   Mouth:  post pharynx pink and moist small aphthous ulcer noted on right ant tonsillar pillar no exudate no tonsillar enlargement Neck:  supple, no thyromegaly, no carotid bruits, and no cervical lymphadenopathy.   Breasts:  skin/areolae normal, no masses, no abnormal thickening, no nipple discharge, no tenderness,  and no adenopathy.   Lungs:  normal breath sounds, no crackles, and no wheezes.   Heart:  normal rate and regular rhythm.   Abdomen:  soft, non-tender, normal bowel sounds, and no hepatomegaly.   Rectal:  external hemorrhoid(s).   Genitalia:  normal introitus, no external lesions, no vaginal discharge, mucosa pink and moist, no vaginal or cervical lesions, no vaginal atrophy, and no friaility or hemorrhage.   fundus and adnexae difficult to palpate with body habitus of note, cervical os very small . . . ? related to conization . Msk:  normal ROM.   Pulses:  R posterior tibial normal, R dorsalis pedis normal, L posterior tibial normal, and L dorsalis pedis normal.   Extremities:  no edema  Neurologic:  alert & oriented X3, cranial nerves II-XII intact, and DTRs symmetrical and normal.   Skin:  turgor normal.   Psych:  normally interactive and good eye contact.     Impression & Recommendations:  Problem # 1:  DEPRESSION (ICD-311)  PHQ9=8 does not want meds. . . not sure she needs no suicidal thoughts would like to see LCSW refer to counseling  Orders: Psychology Referral (Psychology)  Problem # 2:  GERD (ICD-530.81) controlled advised her to stop carafate once she runs out of next prescription will need f/u with me if GERD recurs and consider GI referral at that time  Her updated medication list for this problem includes:    Protonix 40 Mg Tbec (Pantoprazole sodium) .Marland Kitchen... Take 1 tablet by mouth two times a day for stomach.  do not run out or your symptoms will get worse (write in spanish)    Carafate 1 Gm Tabs (Sucralfate) ..... One tablet by mouth three times a day before meals for stomach  Problem # 3:  APHTHOUS ULCERS (ICD-528.2) Rx magic mouthwash  Problem # 4:  URINALYSIS, ABNORMAL (ICD-791.9) check microscopic  Orders: T- * Misc. Laboratory test ([phone])  Problem # 5:  HYPERLIPIDEMIA (ICD-272.4)  Orders: T-Comprehensive Metabolic Panel ([id number])  Problem #  6:  EXAMINATION, ROUTINE MEDICAL (ICD-V70.0) if pap is inadequate, refer to GYN clinic for repeat in 6 mos  Orders: KOH/ WET [place] [phone]) UA Dipstick w/o Micro (manual) (29562) T-HIV Antibody  (Reflex) [phone]) T-Pap Smear, Thin Prep (96295) T- GC Chlamydia (28413) T-Vitamin D (25-Hydroxy) (24401-02725)  Problem # 7:  HYPERTENSION (ICD-401.9) controlled  Her updated medication list for this problem includes:    Benazepril Hcl 10 Mg Tabs (Benazepril hcl) .Marland Kitchen... Take 1 tablet by mouth once a day  Orders: UA Dipstick w/o Micro (manual) (36644) EKG w/ Interpretation (93000) T-Comprehensive Metabolic Panel ([id number])  Complete Medication List: 1)  Benazepril Hcl 10 Mg Tabs (Benazepril hcl) .... Take 1 tablet by mouth once a day 2)  Levothyroxine Sodium 25 Mcg Tabs (Levothyroxine sodium) .Marland Kitchen.. 1 tablet by mouth daily for thyroid 3)  Protonix 40 Mg Tbec (Pantoprazole sodium) .... Take 1 tablet by mouth two times a day for stomach.  do not run out or your symptoms will get worse (write in  spanish) 4)  Naprosyn 500 Mg Tabs (Naproxen) .... Take 1 tablet by mouth two times a day with food for one week, then take two times a day as needed for pain (please write in spanish) 5)  Proventil Hfa 108 (90 Base) Mcg/act Aers (Albuterol sulfate) .Marland Kitchen.. 1-2 puffs q 4-6 hours as needed cough, wheezing (please write in spanish) and educate on use of inhaler 6)  Ibuprofen 800 Mg Tabs (Ibuprofen) .Marland Kitchen.. 1 tab by mouth every 6 hours as needed for sore throat pain--take with food 7)  Carafate 1 Gm Tabs (Sucralfate) .... One tablet by mouth three times a day before meals for stomach 8)  Magic Mouthwash  .... (1 part viscous lidocaine, 1 part maalox, 1 part benadryl) swish, gargle and spit 5 ml every 6-8 hours as needed for throat pain  Other Orders: T-Lipid Profile ([id number])  Patient Instructions: 1)  Schedule appointment with Ethelene Browns. 2)  Take Calcium 600 mg + Vitamin D 400 International Units  two times a day. 3)  When you finish the next prescription of the Carafate (Sucrulfate), do not refill.  Make a follow up appointment if your symptoms return. 4)  Use the Magic Mouthwash every 6-8 hours as needed.  Do not swallow. 5)  Please schedule a follow-up appointment in 4 months with Issachar Broady for blood pressure.  Prescriptions: MAGIC MOUTHWASH (1 part viscous lidocaine, 1 part maalox, 1 part benadryl) Swish, gargle and spit 5 mL every 6-8 hours as needed for throat pain  #100 mL x 0   Entered and Authorized by:   Tereso Newcomer PA-C   Signed by:   Tereso Newcomer PA-C on 10/14/2009   Method used:   Print then Give to Patient   RxID:   [id number]   Laboratory Results   Urine Tests    Routine Urinalysis   Glucose: negative   (Normal Range: Negative) Bilirubin: negative   (Normal Range: Negative) Ketone: negative   (Normal Range: Negative) Spec. Gravity: <1.005   (Normal Range: 1.003-1.035) Blood: trace-lysed   (Normal Range: Negative) pH: 5.5   (Normal Range: 5.0-8.0) Protein: negative   (Normal Range: Negative) Urobilinogen: 0.2   (Normal Range: 0-1) Nitrite: negative   (Normal Range: Negative) Leukocyte Esterace: negative   (Normal Range: Negative)

## 2010-06-27 NOTE — Letter (Signed)
Summary: Handout Printed  Printed Handout:  - Gastroesophageal Reflux Disease (GERD)

## 2010-06-27 NOTE — Assessment & Plan Note (Signed)
Summary: Acute - GERD   Vital Signs:  Patient profile:   45 year old female LMP:     07/13/2009 Weight:      198.8 pounds BMI:     37.09 BSA:     1.90 Temp:     97.4 degrees F oral Pulse rate:   59 / minute Pulse rhythm:   regular Resp:     16 per minute BP sitting:   121 / 83  (left arm) Cuff size:   large  Vitals Entered By: Levon Hedger (July 25, 2009 8:40 AM) CC: the last time she was here she got medication for her stomach and she is still having the same stomach pain..pt states she did not eat today and she feels burning in her stomach, Abdominal Pain Is Patient Diabetic? No Pain Assessment Patient in pain? yes     Location: stomach  Does patient need assistance? Functional Status Self care Ambulation Normal LMP (date): 07/13/2009 LMP - Character: normal     Enter LMP: 07/13/2009 Last PAP Result ENDOMETRIAL CELLS PRESENT.  CLINICAL CORRELATION   Primary Care Provider:  Tereso Newcomer PA-C  CC:  the last time she was here she got medication for her stomach and she is still having the same stomach pain..pt states she did not eat today and she feels burning in her stomach and Abdominal Pain.  History of Present Illness:  Pt into the office for f/u on stomach Pt was last seen by Wende Mott for stomach problems - GERD Pt was started on protonix and increased to two times a day and symptoms have NOT mproved.  Pt reports that she was not advised on what foods to avoid - record indicates otherwise. Recently symptoms exacerbated after eating chili.    No tobacco  No ETOH Drinks Coca-Coca but no coffee Admits to eating spicy foods (ongoing) Naprosyn and Ibuprofen on pt's med list - states she has had this medication in the past due to leg pain but she is not consecutively taking Decreased appetite due to painful swallowing.  Pt is requesting "strep test"  Advised pt that symptoms are likely from reflux but will check today so that she will be confident with GERD  Dx  Spanish interpreter present with pt today Daughter also present with pt    Dyspepsia History:      She has no alarm features of dyspepsia including no history of melena, hematochezia, dysphagia, persistent vomiting, or involuntary weight loss > 5%.  There is a prior history of GERD.  The patient does not have a prior history of documented ulcer disease.  The dominant symptom is heartburn or acid reflux.  An H-2 blocker medication is not currently being taken.  She has no history of a positive H. Pylori serology.  No previous upper endoscopy has been done.     Medications Prior to Update: 1)  Benazepril Hcl 10 Mg Tabs (Benazepril Hcl) .... Take 1 Tablet By Mouth Once A Day 2)  Levothyroxine Sodium 25 Mcg  Tabs (Levothyroxine Sodium) .Marland Kitchen.. 1 Tablet By Mouth Daily For Thyroid 3)  Protonix 40 Mg  Tbec (Pantoprazole Sodium) .... Take 1 Tablet By Mouth Two Times A Day For Stomach.  Do Not Run Out or Your Symptoms Will Get Worse (Write in Spanish) 4)  Naprosyn 500 Mg Tabs (Naproxen) .... Take 1 Tablet By Mouth Two Times A Day With Food For One Week, Then Take Two Times A Day As Needed For Pain (Please Write in Spanish) 5)  Proventil Hfa 108 (90 Base) Mcg/act Aers (Albuterol Sulfate) .Marland Kitchen.. 1-2 Puffs Q 4-6 Hours As Needed Cough, Wheezing (Please Write in Spanish) and Educate On Use of Inhaler 6)  Ibuprofen 800 Mg Tabs (Ibuprofen) .Marland Kitchen.. 1 Tab By Mouth Every 6 Hours As Needed For Sore Throat Pain--Take With Food  Allergies (verified): No Known Drug Allergies  Review of Systems ENT:  Complains of sore throat; denies earache and nasal congestion. CV:  Denies chest pain or discomfort. Resp:  Denies cough. GI:  Complains of indigestion; denies diarrhea and vomiting.  Physical Exam  General:  alert.  obese Head:  normocephalic.   Mouth:  pharynx pink and moist.   Abdomen:  midepigastric pain Msk:  normal ROM.   Neurologic:  alert & oriented X3.     Impression & Recommendations:  Problem # 1:   GERD (ICD-530.81) Pt advised on foods to avoid pt to continue protonix wil add carafate three times a day before meals advised pt that it is extremely important to monitor her diet and AVOID foods that cause exacerbation Her updated medication list for this problem includes:    Protonix 40 Mg Tbec (Pantoprazole sodium) .Marland Kitchen... Take 1 tablet by mouth two times a day for stomach.  do not run out or your symptoms will get worse (write in spanish)    Carafate 1 Gm Tabs (Sucralfate) ..... One tablet by mouth three times a day before meals for stomach  Complete Medication List: 1)  Benazepril Hcl 10 Mg Tabs (Benazepril hcl) .... Take 1 tablet by mouth once a day 2)  Levothyroxine Sodium 25 Mcg Tabs (Levothyroxine sodium) .Marland Kitchen.. 1 tablet by mouth daily for thyroid 3)  Protonix 40 Mg Tbec (Pantoprazole sodium) .... Take 1 tablet by mouth two times a day for stomach.  do not run out or your symptoms will get worse (write in spanish) 4)  Naprosyn 500 Mg Tabs (Naproxen) .... Take 1 tablet by mouth two times a day with food for one week, then take two times a day as needed for pain (please write in spanish) 5)  Proventil Hfa 108 (90 Base) Mcg/act Aers (Albuterol sulfate) .Marland Kitchen.. 1-2 puffs q 4-6 hours as needed cough, wheezing (please write in spanish) and educate on use of inhaler 6)  Ibuprofen 800 Mg Tabs (Ibuprofen) .Marland Kitchen.. 1 tab by mouth every 6 hours as needed for sore throat pain--take with food 7)  Carafate 1 Gm Tabs (Sucralfate) .... One tablet by mouth three times a day before meals for stomach  Other Orders: Rapid Strep (04540)  Patient Instructions: 1)  Continue to take protonix two times a day  2)  ADD 3)  Carafate 1gm by mouth three times a day before meals with glass of water. 4)  Keep appt with S.Weaver for GERD. 5)  Bring all your medications to this visit. Prescriptions: CARAFATE 1 GM TABS (SUCRALFATE) One tablet by mouth three times a day before meals for stomach  #90 x 0   Entered and  Authorized by:   Lehman Prom FNP   Signed by:   Lehman Prom FNP on 07/25/2009   Method used:   Print then Give to Patient   RxID:   [id number]   Laboratory Results  Date/Time Received: July 25, 2009 10:00 AM   Other Tests  Rapid Strep: negative

## 2010-06-27 NOTE — Progress Notes (Signed)
Summary: Office Visit//DEPRESSION SCREENING  Office Visit//DEPRESSION SCREENING   Imported By: Arta Bruce 12/05/2009 10:16:49  _____________________________________________________________________  External Attachment:    Type:   Image     Comment:   External Document

## 2010-06-27 NOTE — Assessment & Plan Note (Signed)
Summary: FOLLOW UP WITH Cleve Paolillo IN 6 WEEEKS FOR YOUR STOMACH//GK   Vital Signs:  Patient profile:   45 year old female Height:      61.5 inches Weight:      198 pounds BMI:     36.94 Temp:     97.8 degrees F Pulse rate:   67 / minute Pulse rhythm:   regular Resp:     18 per minute BP sitting:   121 / 78  (left arm) Cuff size:   large  Vitals Entered By: [place] Shannon (August 12, 2009 10:38 AM) CC: f/u on stomach.... pt says she feels better...., Hypertension Management Is Patient Diabetic? No Pain Assessment Patient in pain? no       Does patient need assistance? Functional Status Self care Ambulation Normal   Primary Care Provider:  Tereso Newcomer PA-C  CC:  f/u on stomach.... pt says she feels better.... and Hypertension Management.  History of Present Illness: Here for f/u. Dorma Russell, NP few weeks ago for worsening epigastric pain.  Placed on sucralfate and feels much better.  Also watching diet.  No more spicy foods or caffeine.  No ETOH.  No hematemesis.  No melena or hematochezia.  No weight loss or night sweats.  Has some throat irritation.  No dysphagia.  No chest pain.  Hypertension History:      She denies chest pain, dyspnea with exertion, peripheral edema, and syncope.  She notes no problems with any antihypertensive medication side effects.        Positive major cardiovascular risk factors include hyperlipidemia and hypertension.  Negative major cardiovascular risk factors include female age less than 17 years old and non-tobacco-user status.     Problems Prior to Update: 1)  Sore Throat  (ICD-462) 2)  Amenorrhea  (ICD-626.0) 3)  Plantar Fasciitis, Right  (ICD-728.71) 4)  Vaginal Pruritus  (ICD-698.1) 5)  Frequency, Urinary  (ICD-788.41) 6)  Microscopic Hematuria  (ICD-599.72) 7)  Allergic Rhinitis, Seasonal  (ICD-477.0) 8)  Screening For Malignant Neoplasm, Cervix  (ICD-V76.2) 9)  Muscle Strain, Right Pectoralis Muscle  (ICD-848.8) 10)  Screening For  Mlig Neop, Breast, Nos  (ICD-V76.10) 11)  Otitis Externa, Acute, Right  (ICD-380.12) 12)  Cough  (ICD-786.2) 13)  Tinea Pedis  (ICD-110.4) 14)  Uri  (ICD-465.9) 15)  Hyperlipidemia  (ICD-272.4) 16)  Screening For Malignant Neoplasm, Cervix  (ICD-V76.2) 17)  Examination, Routine Medical  (ICD-V70.0) 18)  Contraceptive Management  (ICD-V25.09) 19)  Hypothyroidism  (ICD-244.9) 20)  Pap Smear, Abnormal  (ICD-795.00) 21)  Dyslipidemia  (ICD-272.4) 22)  Gerd  (ICD-530.81) 23)  Depression  (ICD-311) 24)  Hypertension  (ICD-401.9)  Current Medications (verified): 1)  Benazepril Hcl 10 Mg Tabs (Benazepril Hcl) .... Take 1 Tablet By Mouth Once A Day 2)  Levothyroxine Sodium 25 Mcg  Tabs (Levothyroxine Sodium) .Marland Kitchen.. 1 Tablet By Mouth Daily For Thyroid 3)  Protonix 40 Mg  Tbec (Pantoprazole Sodium) .... Take 1 Tablet By Mouth Two Times A Day For Stomach.  Do Not Run Out or Your Symptoms Will Get Worse (Write in Spanish) 4)  Naprosyn 500 Mg Tabs (Naproxen) .... Take 1 Tablet By Mouth Two Times A Day With Food For One Week, Then Take Two Times A Day As Needed For Pain (Please Write in Spanish) 5)  Proventil Hfa 108 (90 Base) Mcg/act Aers (Albuterol Sulfate) .Marland Kitchen.. 1-2 Puffs Q 4-6 Hours As Needed Cough, Wheezing (Please Write in Spanish) and Educate On Use of Inhaler 6)  Ibuprofen 800  Mg Tabs (Ibuprofen) .Marland Kitchen.. 1 Tab By Mouth Every 6 Hours As Needed For Sore Throat Pain--Take With Food 7)  Carafate 1 Gm Tabs (Sucralfate) .... One Tablet By Mouth Three Times A Day Before Meals For Stomach  Allergies (verified): No Known Drug Allergies  Physical Exam  General:  alert, well-developed, and well-nourished.   Head:  normocephalic and atraumatic.   Eyes:  pupils equal, pupils round, and pupils reactive to light.   Ears:  R ear normal and L ear normal.   Mouth:  pharynx pink and moist.   Neck:  supple.   Lungs:  normal breath sounds, no crackles, and no wheezes.   Heart:  normal rate and regular rhythm.     Abdomen:  soft, normal bowel sounds, no hepatomegaly, and epigastric tenderness.   Neurologic:  alert & oriented X3 and cranial nerves II-XII intact.   Psych:  normally interactive.     Impression & Recommendations:  Problem # 1:  GERD (ICD-530.81) better continue sucralfate for now if no improvement at f/u consider referral to GI for EGD  Her updated medication list for this problem includes:    Protonix 40 Mg Tbec (Pantoprazole sodium) .Marland Kitchen... Take 1 tablet by mouth two times a day for stomach.  do not run out or your symptoms will get worse (write in spanish)    Carafate 1 Gm Tabs (Sucralfate) ..... One tablet by mouth three times a day before meals for stomach  Problem # 2:  HYPERTENSION (ICD-401.9)  controlled  Her updated medication list for this problem includes:    Benazepril Hcl 10 Mg Tabs (Benazepril hcl) .Marland Kitchen... Take 1 tablet by mouth once a day  Orders: T-Basic Metabolic Panel [id number])  Problem # 3:  HYPOTHYROIDISM (ICD-244.9)  Her updated medication list for this problem includes:    Levothyroxine Sodium 25 Mcg Tabs (Levothyroxine sodium) .Marland Kitchen... 1 tablet by mouth daily for thyroid  Orders: T-TSH ([id number])  Problem # 4:  Preventive Health Care (ICD-V70.0) needs CPP  Complete Medication List: 1)  Benazepril Hcl 10 Mg Tabs (Benazepril hcl) .... Take 1 tablet by mouth once a day 2)  Levothyroxine Sodium 25 Mcg Tabs (Levothyroxine sodium) .Marland Kitchen.. 1 tablet by mouth daily for thyroid 3)  Protonix 40 Mg Tbec (Pantoprazole sodium) .... Take 1 tablet by mouth two times a day for stomach.  do not run out or your symptoms will get worse (write in spanish) 4)  Naprosyn 500 Mg Tabs (Naproxen) .... Take 1 tablet by mouth two times a day with food for one week, then take two times a day as needed for pain (please write in spanish) 5)  Proventil Hfa 108 (90 Base) Mcg/act Aers (Albuterol sulfate) .Marland Kitchen.. 1-2 puffs q 4-6 hours as needed cough, wheezing (please write in spanish)  and educate on use of inhaler 6)  Ibuprofen 800 Mg Tabs (Ibuprofen) .Marland Kitchen.. 1 tab by mouth every 6 hours as needed for sore throat pain--take with food 7)  Carafate 1 Gm Tabs (Sucralfate) .... One tablet by mouth three times a day before meals for stomach  Hypertension Assessment/Plan:      The patient's hypertensive risk group is category B: At least one risk factor (excluding diabetes) with no target organ damage.  Her calculated 10 year risk of coronary heart disease is 6 %.  Today's blood pressure is 121/78.  Her blood pressure goal is < 140/90.  Patient Instructions: 1)  Please schedule a follow-up appointment in 2 months with Quanika Solem for CPP.  2)  Avoid foods high in acid(tomatoes, citrus juices,spicy foods).Avoid eating within two hours of lying down or before exercising. Do not over eat: try smaller more frequent meals. Elevate head of bed twelve inches when sleeping.  3)  Keep taking the Sucralfate for now. Prescriptions: CARAFATE 1 GM TABS (SUCRALFATE) One tablet by mouth three times a day before meals for stomach  #90 x 1   Entered and Authorized by:   Tereso Newcomer PA-C   Signed by:   Tereso Newcomer PA-C on 08/12/2009   Method used:   Print then Give to Patient   RxID:   [id number]

## 2010-06-27 NOTE — Assessment & Plan Note (Signed)
Summary: *VAGINAL PROBLEM,AND NOT FELLING WELL///MC   Vital Signs:  Patient profile:   45 year old female Menstrual status:  irregular LMP:     04/04/2010 Weight:      200.56 pounds Temp:     97.9 degrees F oral Pulse rate:   66 / minute Pulse rhythm:   regular Resp:     22 per minute BP sitting:   148 / 98  (left arm)  Vitals Entered By: Hale Drone, CMA (April 06, 2010 9:53 AM) CC: Pt. is here b/c she states there have been some lesions around and inside her vagina. She states she broke out 2x in Oct. She first noticed this in Sept. and states that this comes and goes. And when she has these lesions, she has no desire of any sexual activity b/c its painful. She also states she had a fever due to these lesions and developed a cold in the begining of October and has not been able to shake it off. She also needs refills on meds.  Is Patient Diabetic? No Pain Assessment Patient in pain? no       Does patient need assistance? Functional Status Self care Ambulation Normal LMP (date): 04/04/2010 LMP - Character: normal     Menstrual Status irregular Enter LMP: 04/04/2010 Last PAP Result NEGATIVE FOR INTRAEPITHELIAL LESIONS OR MALIGNANCY.   Primary Care Provider:  Tereso Newcomer PA-C  CC:  Pt. is here b/c she states there have been some lesions around and inside her vagina. She states she broke out 2x in Oct. She first noticed this in Sept. and states that this comes and goes. And when she has these lesions and she has no desire of any sexual activity b/c its painful. She also states she had a fever due to these lesions and developed a cold in the begining of October and has not been able to shake it off. She also needs refills on meds. .  History of Present Illness: Genital lesions as above:  Pt. states had pimples inside of vagina for first time 1 year ago.  Noted again 2 months ago.  Husband has also had this on penis.  Pt. states via interpretation that looks more like boils  than blisters--no ulcers.  Has lesions inside thighs as well.  Never drain, though she has tried to pop them.  Very painful to urinate when has them.   Current Medications (verified): 1)  Benazepril Hcl 10 Mg Tabs (Benazepril Hcl) .... Take 1 Tablet By Mouth Once A Day 2)  Levothyroxine Sodium 25 Mcg  Tabs (Levothyroxine Sodium) .Marland Kitchen.. 1 Tablet By Mouth Daily For Thyroid 3)  Protonix 40 Mg  Tbec (Pantoprazole Sodium) .... Take 1 Tablet By Mouth Two Times A Day For Stomach.  Do Not Run Out or Your Symptoms Will Get Worse (Write in Spanish) 4)  Naprosyn 500 Mg Tabs (Naproxen) .... Take 1 Tablet By Mouth Two Times A Day With Food For One Week, Then Take Two Times A Day As Needed For Pain (Please Write in Spanish) 5)  Proventil Hfa 108 (90 Base) Mcg/act Aers (Albuterol Sulfate) .Marland Kitchen.. 1-2 Puffs Q 4-6 Hours As Needed Cough, Wheezing (Please Write in Spanish) and Educate On Use of Inhaler 6)  Ibuprofen 800 Mg Tabs (Ibuprofen) .Marland Kitchen.. 1 Tab By Mouth Every 6 Hours As Needed For Sore Throat Pain--Take With Food 7)  Carafate 1 Gm Tabs (Sucralfate) .... One Tablet By Mouth Three Times A Day Before Meals For Stomach 8)  Magic Mouthwash .... (  1 Part Viscous Lidocaine, 1 Part Maalox, 1 Part Benadryl) Swish, Gargle and Spit 5 Ml Every 6-8 Hours As Needed For Throat Pain 9)  Loratadine 10 Mg Tabs (Loratadine) .Marland Kitchen.. 1 Tab By Mouth Daily 10)  Fluticasone Propionate 50 Mcg/act Susp (Fluticasone Propionate) .... 2 Sprays Each Nostril Once Daily As Needed Allergies 11)  Vitamin D 1000 Unit Tabs (Cholecalciferol) .... Take 1 Tablet By Mouth Once A Day  Allergies (verified): No Known Drug Allergies  Physical Exam  General:  Obese, NAD Genitalia:  No active lesions.   Genital area tissue looks healthy. 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 Discussed local care to prevent and to treat if recurs.  Complete Medication List: 1)  Benazepril Hcl 10 Mg Tabs (Benazepril hcl) .... Take 1 tablet by mouth once a day 2)  Levothyroxine Sodium 25 Mcg Tabs (Levothyroxine sodium) .Marland Kitchen.. 1 tablet by mouth daily for thyroid 3)  Protonix 40 Mg Tbec (Pantoprazole sodium) .... Take 1 tablet by mouth two times a day for stomach.  do not run out or your symptoms will get worse (write in spanish) 4)  Naprosyn 500 Mg Tabs (Naproxen) .... Take 1 tablet by mouth two times a day with food for one week, then take two times a day as needed for pain (please write in spanish) 5)  Proventil Hfa 108 (90 Base) Mcg/act Aers (Albuterol sulfate) .Marland Kitchen.. 1-2 puffs q 4-6 hours as needed cough, wheezing (please write in spanish) and educate on use of inhaler 6)  Ibuprofen 800 Mg Tabs (Ibuprofen) .Marland Kitchen.. 1 tab by mouth every 6 hours as needed for sore throat pain--take with food 7)  Carafate 1 Gm Tabs (Sucralfate) .... One tablet by mouth three times a day before meals for stomach 8)  Magic Mouthwash  .... (1 part viscous lidocaine, 1 part maalox, 1 part benadryl) swish, gargle and spit 5 ml every 6-8 hours as needed for throat pain 9)  Loratadine 10 Mg Tabs (Loratadine) .Marland Kitchen.. 1 tab by mouth daily 10)  Fluticasone Propionate 50 Mcg/act Susp (Fluticasone propionate) .... 2 sprays each nostril once daily as needed allergies 11)  Vitamin D 1000 Unit Tabs (Cholecalciferol) .... Take 1 tablet by mouth once a day  Patient Instructions: 1)  Call and see if can be seen when having active lesions 2)  Wash genital area with Dial Soap daily--have husband do same--he should retract foreskin and clean beneath--use Dial soap only if does not irritate skin. Prescriptions: PROTONIX 40 MG  TBEC (PANTOPRAZOLE SODIUM) Take 1 tablet by mouth two times a day for stomach.  Do not run out or your symptoms will get worse (write in Spanish)  #60 x 11   Entered and Authorized by:   Julieanne Manson MD   Signed by:   Julieanne Manson MD on 04/06/2010   Method used:   Faxed to ...       [facility] - [facility] (retail)       [address] [place], [place]  [address]       Ph: [phone]       Fax: [phone]   RxID:   [id number] LEVOTHYROXINE SODIUM 25 MCG  TABS (LEVOTHYROXINE SODIUM) 1 tablet by mouth daily for thyroid  #30 x 11   Entered and Authorized  by:   Julieanne Manson MD   Signed by:   Julieanne Manson MD on 04/06/2010   Method used:   Faxed to ...       [facility] - Pharmac (retail)       [address] [place], [place]  [address]       Ph: [phone]       Fax: [phone]   RxID:   [id number] BENAZEPRIL HCL 10 MG TABS (BENAZEPRIL HCL) Take 1 tablet by mouth once a day  #30 x 11   Entered and Authorized by:   Julieanne Manson MD   Signed by:   Julieanne Manson MD on 04/06/2010   Method used:   Faxed to ...       [facility] - Pharmac (retail)       [address] [place], [place]  [address]       Ph: [phone]       Fax: [phone]   RxID:   [id number]    Orders Added: 1)  Est. Patient Level III [phone]

## 2010-06-27 NOTE — Progress Notes (Signed)
Summary: Side effects from pain med   Phone Note Call from Patient   Summary of Call: The pt went yesterday to Urgent Care because she has two ungrow nails and they extracted.  During her visit, the Urgent Care prescribed her two medications penecilin and hydrocodone but both medications are cousing her some side effects including dizzyness, vomiting, headache.  Pt states that the hydrocodone may make her sick.  Pt orange card is not longer update. Alben Spittle PA-c Initial call taken by: Manon Hilding,  November 01, 2009 9:11 AM  Follow-up for Phone Call        Pt. advised to stop hydrocodone and either notify the Urgent Care and ask for another pain med or to try OTC meds such as Tylenol, ibuprofen, Alleve.  To take ibuprofen or Alleve and PCN with food.  Pt. states that she will try OTC meds first; Instructed that if her pain worsens or is unrelieved by OTC meds, that she should call the urgent care.  Dutch Quint RN  November 01, 2009 9:18 AM  Follow-up by: Dutch Quint RN,  November 01, 2009 9:18 AM

## 2010-06-27 NOTE — Assessment & Plan Note (Signed)
Summary: *POSS EAR INFECTION/LR   Vital Signs:  Patient profile:   45 year old female Height:      61.5 inches Weight:      202.6 pounds Temp:     97.7 degrees F oral Pulse rate:   78 / minute Pulse rhythm:   regular Resp:     28 per minute BP sitting:   124 / 86  (left arm) Cuff size:   regular  Vitals Entered By: Michelle Nasuti (February 10, 2010 4:09 PM) CC: headache, congestion, and R ear pain x 4 days Pain Assessment Patient in pain? yes      Intensity: 10  Does patient need assistance? Ambulation Normal   Primary Care Provider:  Tereso Newcomer PA-C  CC:  headache, congestion, and and R ear pain x 4 days.  History of Present Illness: 45 yo female pt. of S. Alben Spittle, PA-C.  Right ear pain for 4 days.  Pain is throbbing and hears a little noise inside of ear--buzzing.  Does have dizziness and head pressure when leans forward.  1 week ago, did have nasal congestion and cold symptoms--that is better now.  Hearing is decreased in her right ear.  Has not been able to sleep with the pain.  When pulls on external ear, hurts.  No water in ear recently, but has been putting Q tips in her ear.  Was treated with Cortisporin for same symptoms last year.  Later, becomes evident that the buzzing in her ear is her bigger complaint, the pain is minimal in comparison.  No definite fever.  Has used hydrogen peroxide drops in her ear--no real help.  Ibuprofen 400 mg helps for about 3 hours.  Allergies (verified): No Known Drug Allergies  Physical Exam  General:  Some discomfort--intermittently puts hand to right ear. Head:  NT over sinuses. Eyes:  No corneal or conjunctival inflammation noted. EOMI. Perrla. Funduscopic exam benign, without hemorrhages, exudates or papilledema. Vision grossly normal. Ears:  No erythema of either canal, no exudate.  TM a bit dull on the right with some scarring, TM pearly gray on left Nose:  minimal edema of mucosa Mouth:  No exudate, possibly minimal  erythema of tonsillar area. Neck:  No masses or lymphadenopathy, though possibly mild tenderness over right submandibular area Lungs:  Normal respiratory effort, chest expands symmetrically. Lungs are clear to auscultation, no crackles or wheezes. Heart:  Normal rate and regular rhythm. S1 and S2 normal without gallop, murmur, click, rub or other extra sounds.   Impression & Recommendations:  Problem # 1:  EUSTACHIAN TUBE DYSFUNCTION, RIGHT (ICD-381.81) Motrin for pain as needed until resolves Start allergy meds.  Complete Medication List: 1)  Benazepril Hcl 10 Mg Tabs (Benazepril hcl) .... Take 1 tablet by mouth once a day 2)  Levothyroxine Sodium 25 Mcg Tabs (Levothyroxine sodium) .Marland Kitchen.. 1 tablet by mouth daily for thyroid 3)  Protonix 40 Mg Tbec (Pantoprazole sodium) .... Take 1 tablet by mouth two times a day for stomach.  do not run out or your symptoms will get worse (write in spanish) 4)  Naprosyn 500 Mg Tabs (Naproxen) .... Take 1 tablet by mouth two times a day with food for one week, then take two times a day as needed for pain (please write in spanish) 5)  Proventil Hfa 108 (90 Base) Mcg/act Aers (Albuterol sulfate) .Marland Kitchen.. 1-2 puffs q 4-6 hours as needed cough, wheezing (please write in spanish) and educate on use of inhaler 6)  Ibuprofen 800 Mg Tabs (  Ibuprofen) .Marland Kitchen.. 1 tab by mouth every 6 hours as needed for sore throat pain--take with food 7)  Carafate 1 Gm Tabs (Sucralfate) .... One tablet by mouth three times a day before meals for stomach 8)  Magic Mouthwash  .... (1 part viscous lidocaine, 1 part maalox, 1 part benadryl) swish, gargle and spit 5 ml every 6-8 hours as needed for throat pain 9)  Loratadine 10 Mg Tabs (Loratadine) .Marland Kitchen.. 1 tab by mouth daily 10)  Fluticasone Propionate 50 Mcg/act Susp (Fluticasone propionate) .... 2 sprays each nostril once daily as needed allergies  Patient Instructions: 1)  Follow up with Wende Mott as planned on 20th 2)  Ibuprofen 200 mg 2-4  pastillas cada seis horas para dolor el oehodo Prescriptions: FLUTICASONE PROPIONATE 50 MCG/ACT SUSP (FLUTICASONE PROPIONATE) 2 sprays each nostril once daily as needed allergies  #1 x 4   Entered and Authorized by:   Julieanne Manson MD   Signed by:   Julieanne Manson MD on 02/10/2010   Method used:   Faxed to ...       [facility] - Pharmac (retail)       [address] [place], [place]  [address]       Ph: [phone]       Fax: [phone]   RxID:   [id number] LORATADINE 10 MG TABS (LORATADINE) 1 tab by mouth daily  #30 x 4   Entered and Authorized by:   Julieanne Manson MD   Signed by:   Julieanne Manson MD on 02/10/2010   Method used:   Faxed to ...       [facility] - Pharmac (retail)       [address] [place], [place]  [address]       Ph: [phone]       Fax: [phone]   RxID:   [id number]

## 2010-06-27 NOTE — Letter (Signed)
Summary: Handout Printed  Printed Handout:  - Hypothyroidism 

## 2010-06-27 NOTE — Letter (Signed)
Summary: *HSN Results Follow up  [facility]  [address] [place], [place] [address]   Phone: [phone]  Fax: [phone]      10/18/2009   Candace Rodgers [address] [place], [place]  [address]   Dear  Candace Rodgers,                            ____S.Drinkard,FNP   ____D. Gore,FNP       ____B. McPherson,MD   ____V. Rankins,MD    ____E. Mulberry,MD    ____N. Daphine Deutscher, FNP  ____D. Reche Dixon, MD    ____K. Philipp Deputy, MD    __x__S. Alben Spittle, PA-C     This letter is to inform you that your recent test(s):  ___x____Pap Smear    _______Lab Test     _______X-ray    ___x____ is within acceptable limits  _______ requires a medication change  _______ requires a follow-up lab visit  _______ requires a follow-up visit with your provider   Comments:       _________________________________________________________ If you have any questions, please contact our office                     Sincerely,  Tereso Newcomer PA-C [facility]

## 2010-06-27 NOTE — Assessment & Plan Note (Signed)
Summary: FOLLOW UP WITH Candace Rodgers FOR BP AND STOMACH PAIN//GK   Vital Signs:  Patient profile:   45 year old female Height:      61.5 inches Weight:      197 pounds BMI:     36.75 Temp:     97.6 degrees F oral Pulse rate:   56 / minute Pulse rhythm:   regular Resp:     18 per minute BP sitting:   126 / 77  (left arm) Cuff size:   large  Vitals Entered By: [place] Shannon (July 01, 2009 9:59 AM) CC: f/u.... pt says she is still getting heartburn and its getting worst...Marland KitchenMarland Kitchen pt does need refills on meds..., Hypertension Management Is Patient Diabetic? No Pain Assessment Patient in pain? no       Does patient need assistance? Functional Status Self care Ambulation Normal   Primary Care Provider:  Tereso Newcomer PA-C  CC:  f/u.... pt says she is still getting heartburn and its getting worst...Marland KitchenMarland Kitchen pt does need refills on meds... and Hypertension Management.  History of Present Illness: 45 year old female presents for followup on acid reflux.  She restarted her protonix in November.  She had been doing well up until the middle of January.  She started having worsening reflux at that time.  She denies hematemesis.  She denies hematochezia.  She denies melena.  She denies weight loss.  She does have some dysphagia.  There is some questionable odynophagia.  She is not eating spicy foods.  She is not drinking that much caffeine.  She denies smoking tobacco.  She denies drinking alcohol.  Of note, at the end of the interview, she told me that she has now run out of her Protonix.  It's unclear whether or not her symptoms worsened after she ran Protonix her before.  She seems to think that her symptoms started to worsen before.  She does have some epigastric pain.  Hypertension History:      She denies headache, chest pain, dyspnea with exertion, and syncope.        Positive major cardiovascular risk factors include hyperlipidemia and hypertension.  Negative major cardiovascular risk factors  include female age less than 52 years old and non-tobacco-user status.     Problems Prior to Update: 1)  Sore Throat  (ICD-462) 2)  Amenorrhea  (ICD-626.0) 3)  Plantar Fasciitis, Right  (ICD-728.71) 4)  Vaginal Pruritus  (ICD-698.1) 5)  Frequency, Urinary  (ICD-788.41) 6)  Microscopic Hematuria  (ICD-599.72) 7)  Allergic Rhinitis, Seasonal  (ICD-477.0) 8)  Screening For Malignant Neoplasm, Cervix  (ICD-V76.2) 9)  Muscle Strain, Right Pectoralis Muscle  (ICD-848.8) 10)  Screening For Mlig Neop, Breast, Nos  (ICD-V76.10) 11)  Otitis Externa, Acute, Right  (ICD-380.12) 12)  Cough  (ICD-786.2) 13)  Tinea Pedis  (ICD-110.4) 14)  Uri  (ICD-465.9) 15)  Hyperlipidemia  (ICD-272.4) 16)  Screening For Malignant Neoplasm, Cervix  (ICD-V76.2) 17)  Examination, Routine Medical  (ICD-V70.0) 18)  Contraceptive Management  (ICD-V25.09) 19)  Hypothyroidism  (ICD-244.9) 20)  Pap Smear, Abnormal  (ICD-795.00) 21)  Dyslipidemia  (ICD-272.4) 22)  Gerd  (ICD-530.81) 23)  Depression  (ICD-311) 24)  Hypertension  (ICD-401.9)  Allergies: No Known Drug Allergies  Physical Exam  General:  alert, well-developed, and well-nourished.   Head:  normocephalic and atraumatic.   Neck:  supple.   Lungs:  normal breath sounds, no crackles, and no wheezes.   Heart:  normal rate and regular rhythm.   Abdomen:  soft, normal  bowel sounds, no hepatomegaly, incisional hernia, and epigastric tenderness.   Neurologic:  alert & oriented X3 and cranial nerves II-XII intact.   Psych:  normally interactive.     Impression & Recommendations:  Problem # 1:  GERD (ICD-530.81)  discussed changing treatment plan with patient  then, she informed me that she was out of protonix explained that running out of PPIs will make symptoms worse will check stool cards, CBC, h. pylori IgM increase protonix to two times a day f/u in 6 weeks was going to send for UGI series, but will reassess first and consider UGI depending on  symptoms  Her updated medication list for this problem includes:    Protonix 40 Mg Tbec (Pantoprazole sodium) .Marland Kitchen... Take 1 tablet by mouth two times a day for stomach.  do not run out or your symptoms will get worse (write in spanish)  Orders: T-CBC w/Diff ([address]) T-H Pylori Antibody IgM ([address]91478) Hemoccult Cards -3 specimans (take home) ([address])  Problem # 2:  HYPERTENSION (ICD-401.9) needs meds refilled  Her updated medication list for this problem includes:    Benazepril Hcl 10 Mg Tabs (Benazepril hcl) .Marland Kitchen... Take 1 tablet by mouth once a day  Problem # 3:  HYPOTHYROIDISM (ICD-244.9) needs meds refilled  Her updated medication list for this problem includes:    Levothyroxine Sodium 25 Mcg Tabs (Levothyroxine sodium) .Marland Kitchen... 1 tablet by mouth daily for thyroid  Complete Medication List: 1)  Benazepril Hcl 10 Mg Tabs (Benazepril hcl) .... Take 1 tablet by mouth once a day 2)  Levothyroxine Sodium 25 Mcg Tabs (Levothyroxine sodium) .Marland Kitchen.. 1 tablet by mouth daily for thyroid 3)  Protonix 40 Mg Tbec (Pantoprazole sodium) .... Take 1 tablet by mouth two times a day for stomach.  do not run out or your symptoms will get worse (write in spanish) 4)  Naprosyn 500 Mg Tabs (Naproxen) .... Take 1 tablet by mouth two times a day with food for one week, then take two times a day as needed for pain (please write in spanish) 5)  Proventil Hfa 108 (90 Base) Mcg/act Aers (Albuterol sulfate) .Marland Kitchen.. 1-2 puffs q 4-6 hours as needed cough, wheezing (please write in spanish) and educate on use of inhaler 6)  Ibuprofen 800 Mg Tabs (Ibuprofen) .Marland Kitchen.. 1 tab by mouth every 6 hours as needed for sore throat pain--take with food  Hypertension Assessment/Plan:      The patient's hypertensive risk group is category B: At least one risk factor (excluding diabetes) with no target organ damage.  Her calculated 10 year risk of coronary heart disease is 6 %.  Today's blood pressure is 126/77.  Her blood pressure goal  is < 140/90.  Patient Instructions: 1)  Take Protonix two times a day every day. 2)  Do not run out or your symptoms will get much worse. 3)  Avoid foods high in acid(tomatoes, citrus juices,spicy foods).Avoid eating within two hours of lying down or before exercising. Do not over eat: try smaller more frequent meals. Elevate head of bed twelve inches when sleeping.  4)  Call in 2 weeks if your symptoms are no better. 5)  Schedule a follow up appointment with Hawley Pavia in 6 weeks for your stomach. 6)  All your medicines have been sent to the [facility]. pharmacy and can be picked up in the next 3-4 days.  You can call today to see if they will be ready today. Prescriptions: LEVOTHYROXINE SODIUM 25 MCG  TABS (LEVOTHYROXINE SODIUM) 1 tablet  by mouth daily for thyroid  #30 x 5   Entered and Authorized by:   Tereso Newcomer PA-C   Signed by:   Tereso Newcomer PA-C on 07/01/2009   Method used:   Faxed to ...       [facility] - Pharmac (retail)       [address] [place], [place]  [address]       Ph: [phone]       Fax: [phone]   RxID:   [id number] BENAZEPRIL HCL 10 MG TABS (BENAZEPRIL HCL) Take 1 tablet by mouth once a day  #30 x 5   Entered and Authorized by:   Tereso Newcomer PA-C   Signed by:   Tereso Newcomer PA-C on 07/01/2009   Method used:   Faxed to ...       [facility] - Pharmac (retail)       [address] [place], [place]  [address]       Ph: [phone]       Fax: [phone]   RxID:   [id number] PROTONIX 40 MG  TBEC (PANTOPRAZOLE SODIUM) Take 1 tablet by mouth two times a day for stomach.  Do not run out or your symptoms will get worse (write in Spanish)  #60 x 5   Entered and Authorized by:   Tereso Newcomer PA-C   Signed by:   Tereso Newcomer PA-C on 07/01/2009   Method used:   Faxed to ...       [facility] - Pharmac (retail)       [address] [place], [place]   [address]       Ph: [phone]       Fax: [phone]   RxID:   [id number]

## 2010-06-27 NOTE — Assessment & Plan Note (Signed)
Summary: HTN; Hypothyroidism; Excessive Vaginal Bleeding   Vital Signs:  Patient profile:   45 year old female Height:      61.5 inches Weight:      200 pounds BMI:     37.31 Temp:     97.3 degrees F oral Pulse rate:   64 / minute Pulse rhythm:   regular Resp:     20 per minute BP sitting:   118 / 82  (left arm) Cuff size:   large  Vitals Entered By: Armenia Shannon (February 14, 2010 8:45 AM) CC: four month f/u... Is Patient Diabetic? No Pain Assessment Patient in pain? no       Does patient need assistance? Functional Status Self care Ambulation Normal   Primary Care Karel Mowers:  Tereso Newcomer PA-C  CC:  four month f/u....  History of Present Illness: Here for f/u on HTN and thyroid. No chest pain, headaches, sob.   Tolerating medicines well.  No problems with meds.  Notes onset of menstrual cycle 8/22.  Flow has not stopped.  Denies dysuria at first.  Then, notes dysuria.  Some pelvic pressure.  Has never had prolonged cycles like this.  NO fever or chills.  No discharge.  Has one partner.  Does note urinary frequency and urgency.  Problems Prior to Update: 1)  Dysuria  (ICD-788.1) 2)  Excessive Menstrual Bleeding  (ICD-626.2) 3)  Eustachian Tube Dysfunction, Right  (ICD-381.81) 4)  Aphthous Ulcers  (ICD-528.2) 5)  Urinalysis, Abnormal  (ICD-791.9) 6)  Sore Throat  (ICD-462) 7)  Amenorrhea  (ICD-626.0) 8)  Plantar Fasciitis, Right  (ICD-728.71) 9)  Vaginal Pruritus  (ICD-698.1) 10)  Frequency, Urinary  (ICD-788.41) 11)  Microscopic Hematuria  (ICD-599.72) 12)  Allergic Rhinitis, Seasonal  (ICD-477.0) 13)  Screening For Malignant Neoplasm, Cervix  (ICD-V76.2) 14)  Muscle Strain, Right Pectoralis Muscle  (ICD-848.8) 15)  Screening For Mlig Neop, Breast, Nos  (ICD-V76.10) 16)  Otitis Externa, Acute, Right  (ICD-380.12) 17)  Cough  (ICD-786.2) 18)  Tinea Pedis  (ICD-110.4) 19)  Uri  (ICD-465.9) 20)  Hyperlipidemia  (ICD-272.4) 21)  Screening For Malignant  Neoplasm, Cervix  (ICD-V76.2) 22)  Examination, Routine Medical  (ICD-V70.0) 23)  Contraceptive Management  (ICD-V25.09) 24)  Hypothyroidism  (ICD-244.9) 25)  Pap Smear, Abnormal  (ICD-795.00) 26)  Dyslipidemia  (ICD-272.4) 27)  Gerd  (ICD-530.81) 28)  Depression  (ICD-311) 29)  Hypertension  (ICD-401.9)  Current Medications (verified): 1)  Benazepril Hcl 10 Mg Tabs (Benazepril Hcl) .... Take 1 Tablet By Mouth Once A Day 2)  Levothyroxine Sodium 25 Mcg  Tabs (Levothyroxine Sodium) .Marland Kitchen.. 1 Tablet By Mouth Daily For Thyroid 3)  Protonix 40 Mg  Tbec (Pantoprazole Sodium) .... Take 1 Tablet By Mouth Two Times A Day For Stomach.  Do Not Run Out or Your Symptoms Will Get Worse (Write in Spanish) 4)  Naprosyn 500 Mg Tabs (Naproxen) .... Take 1 Tablet By Mouth Two Times A Day With Food For One Week, Then Take Two Times A Day As Needed For Pain (Please Write in Spanish) 5)  Proventil Hfa 108 (90 Base) Mcg/act Aers (Albuterol Sulfate) .Marland Kitchen.. 1-2 Puffs Q 4-6 Hours As Needed Cough, Wheezing (Please Write in Spanish) and Educate On Use of Inhaler 6)  Ibuprofen 800 Mg Tabs (Ibuprofen) .Marland Kitchen.. 1 Tab By Mouth Every 6 Hours As Needed For Sore Throat Pain--Take With Food 7)  Carafate 1 Gm Tabs (Sucralfate) .... One Tablet By Mouth Three Times A Day Before Meals For Stomach 8)  Magic  Mouthwash .... (1 Part Viscous Lidocaine, 1 Part Maalox, 1 Part Benadryl) Swish, Gargle and Spit 5 Ml Every 6-8 Hours As Needed For Throat Pain 9)  Loratadine 10 Mg Tabs (Loratadine) .Marland Kitchen.. 1 Tab By Mouth Daily 10)  Fluticasone Propionate 50 Mcg/act Susp (Fluticasone Propionate) .... 2 Sprays Each Nostril Once Daily As Needed Allergies  Allergies (verified): No Known Drug Allergies  Past History:  Past Medical History: Last updated: 10/14/2009 Depression Hyperlipidemia Hypertension Hypothyroidism h/o abnormal pap with conization 2008  Physical Exam  General:  alert, well-developed, and well-nourished.   Head:  normocephalic  and atraumatic.   Neck:  supple.   Lungs:  normal breath sounds.   Heart:  normal rate and regular rhythm.   Genitalia:  normal introitus, no external lesions, no vaginal discharge, mucosa pink and moist, no vaginal or cervical lesions, no vaginal atrophy, no friaility or hemorrhage, normal uterus size and position, and no adnexal masses or tenderness.   Msk:  no CVA tend to percussion Neurologic:  alert & oriented X3 and cranial nerves II-XII intact.   Psych:  normally interactive.     Impression & Recommendations:  Problem # 1:  HYPERTENSION (ICD-401.9) controlled  Her updated medication list for this problem includes:    Benazepril Hcl 10 Mg Tabs (Benazepril hcl) .Marland Kitchen... Take 1 tablet by mouth once a day  Problem # 2:  HYPOTHYROIDISM (ICD-244.9) check TSH today  Her updated medication list for this problem includes:    Levothyroxine Sodium 25 Mcg Tabs (Levothyroxine sodium) .Marland Kitchen... 1 tablet by mouth daily for thyroid  Orders: T-TSH ([id number])  Problem # 3:  EXCESSIVE MENSTRUAL BLEEDING (ICD-626.2) no bleeding on exam likely has stopped not sure if she is describing hematuria but seems to deny prob perimenopausal check FSH if has further problems could use progesterone x 10 days  Orders: T-Urinalysis ([id number]) T-Culture, Urine ([id number]) T- * Misc. Laboratory test [phone]) Urine Pregnancy Test  [phone]) T- GC Chlamydia (02725) KOH/ WET [place] [phone]) T-FSH [id number])  Problem # 4:  DYSURIA (ICD-788.1) has symptoms of UTI check u/a  Orders: T-Urinalysis ([id number]) T-Culture, Urine ([id number]) T- * Misc. Laboratory test [phone])  Complete Medication List: 1)  Benazepril Hcl 10 Mg Tabs (Benazepril hcl) .... Take 1 tablet by mouth once a day 2)  Levothyroxine Sodium 25 Mcg Tabs (Levothyroxine sodium) .Marland Kitchen.. 1 tablet by mouth daily for thyroid 3)  Protonix 40 Mg Tbec (Pantoprazole sodium) .... Take 1 tablet by mouth two times a day for stomach.  do not run  out or your symptoms will get worse (write in spanish) 4)  Naprosyn 500 Mg Tabs (Naproxen) .... Take 1 tablet by mouth two times a day with food for one week, then take two times a day as needed for pain (please write in spanish) 5)  Proventil Hfa 108 (90 Base) Mcg/act Aers (Albuterol sulfate) .Marland Kitchen.. 1-2 puffs q 4-6 hours as needed cough, wheezing (please write in spanish) and educate on use of inhaler 6)  Ibuprofen 800 Mg Tabs (Ibuprofen) .Marland Kitchen.. 1 tab by mouth every 6 hours as needed for sore throat pain--take with food 7)  Carafate 1 Gm Tabs (Sucralfate) .... One tablet by mouth three times a day before meals for stomach 8)  Magic Mouthwash  .... (1 part viscous lidocaine, 1 part maalox, 1 part benadryl) swish, gargle and spit 5 ml every 6-8 hours as needed for throat pain 9)  Loratadine 10 Mg Tabs (Loratadine) .Marland Kitchen.. 1 tab by mouth daily 10)  Fluticasone Propionate 50  Mcg/act Susp (Fluticasone propionate) .... 2 sprays each nostril once daily as needed allergies  Other Orders: TD Toxoids IM 7 YR + (36644) Admin 1st Vaccine (03474) Admin 1st Vaccine [facility]) [id number])  Patient Instructions: 1)  Td shot today. 2)  Schedule follow up for blood pressure in 4 months. 3)  Return sooner if cycles continue to change.  Laboratory Results   Urine Tests    Routine Urinalysis   Color: yellow Appearance: Clear Glucose: negative   (Normal Range: Negative) Bilirubin: negative   (Normal Range: Negative) Ketone: negative   (Normal Range: Negative) Spec. Gravity: <1.005   (Normal Range: 1.003-1.035) Blood: trace-lysed   (Normal Range: Negative) pH: 5.5   (Normal Range: 5.0-8.0) Protein: negative   (Normal Range: Negative) Urobilinogen: 0.2   (Normal Range: 0-1) Nitrite: negative   (Normal Range: Negative) Leukocyte Esterace: negative   (Normal Range: Negative)         Tetanus/Td Vaccine    Vaccine Type: Td    Site: right deltoid    Mfr: Sanofi Pasteur    Dose: 0.5 ml    Route: IM    Given  by: Armenia Shannon    Exp. Date: 06/09/2012    Lot #: [id number]    VIS given: 04/14/08 version given February 14, 2010.

## 2010-06-27 NOTE — Letter (Signed)
Summary: *HSN Results Follow up  [facility]  [address] [place], [place] [address]   Phone: [phone]  Fax: [phone]      08/13/2009   JAYEL SCADUTO [address] [place], [place]  [address]   Dear  Ms. Candace Rodgers,                            ____S.Drinkard,FNP   ____D. Gore,FNP       ____B. McPherson,MD   ____V. Rankins,MD    ____E. Mulberry,MD    ____N. Daphine Deutscher, FNP  ____D. Reche Dixon, MD    ____K. Philipp Deputy, MD    __x__S. Alben Spittle, PA-C     This letter is to inform you that your recent test(s):  _______Pap Smear    ___x____Lab Test     _______X-ray    ___x____ is within acceptable limits  _______ requires a medication change  _______ requires a follow-up lab visit  _______ requires a follow-up visit with your provider   Comments:       _________________________________________________________ If you have any questions, please contact our office                     Sincerely,  Tereso Newcomer PA-C [facility]

## 2010-06-29 NOTE — Assessment & Plan Note (Signed)
Summary: *2236 PT///BP/LR   Vital Signs:  Patient profile:   45 year old female Menstrual status:  irregular LMP:     04/27/2010 Weight:      201.25 pounds Temp:     97.1 degrees F oral Pulse rate:   70 / minute Pulse rhythm:   regular Resp:     28 per minute BP sitting:   136 / 96  (left arm) Cuff size:   regular  Vitals Entered By: Hale Drone CMA (June 02, 2010 2:44 PM) CC: f/u from 02/14/10 on BP. Complaining of chest pains that will radiat to her upper back x3/4 days. Could not sleep last night due to the pain. It was a sharp/stinning pain. Chest pains are worse at night and during the day she will still have them but they are mild pains.Also throat bothering her. Feels like it tightens up x1 month. Having difficulty breathing. Has  not p/u her Protonix b/c of communication problems.  Is Patient Diabetic? No Pain Assessment Patient in pain? yes     Location: chest Intensity: 5 Type: sharp Onset of pain  Constant  Does patient need assistance? Functional Status Self care Ambulation Normal LMP (date): 04/27/2010 LMP - Character: normal     Enter LMP: 04/27/2010 Last PAP Result NEGATIVE FOR INTRAEPITHELIAL LESIONS OR MALIGNANCY.   Primary Care Provider:  Tereso Newcomer PA-C  CC:  f/u from 02/14/10 on BP. Complaining of chest pains that will radiat to her upper back x3/4 days. Could not sleep last night due to the pain. It was a sharp/stinning pain. Chest pains are worse at night and during the day she will still have them but they are mild pains.Also throat bothering her. Feels like it tightens up x1 month. Having difficulty breathing. Has  not p/u her Protonix b/c of communication problems. .  History of Present Illness: 1. Left anterior chest pain for past 4 days.  Pinching pain that radiates straight to back.  Does not radiate to jaw or arm.  Can last several hours. Since started, always has at least a little bit of pain.   When first started, awakened in middle of  night.  When presses on the area and worries, the pain worsens.  Not clear how much using muscles of trunk exacerbates the pain, but apparently a little bit.  Hx of hypercholesterolemia, Htn, hypothyroidism, overweight, no CAD hx in family, though FH of DM.  Pt. denies DM and no evidence in chart.  Has never had this problem before.  No hx of overdoing or injuring the area  recently.    Initially, denies hearburn,  but has had a chronic cough at night she relates to URI symptoms about 1 month ago.   The cough awakens her at night.  Taking Protonix two times a day and if misses dose, does have symptoms.  Taking on empty stomach.  Ultimately, states she has been off her Protonix for 2 weeks trying to get refilled--has been unable to do so as always put on hold.  Throat is also sore.  2. 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  Has cramping of hands and drops things at times.  Current Medications (verified): 1)  Benazepril Hcl 10 Mg Tabs (Benazepril Hcl) .... Take 1 Tablet By Mouth Once A Day 2)  Levothyroxine Sodium 25 Mcg  Tabs (Levothyroxine Sodium) .Marland Kitchen.. 1 Tablet By Mouth Daily For Thyroid 3)  Protonix 40 Mg  Tbec (Pantoprazole Sodium) .... Take 1 Tablet By Mouth Two  Times A Day For Stomach.  Do Not Run Out or Your Symptoms Will Get Worse (Write in Spanish) 4)  Naprosyn 500 Mg Tabs (Naproxen) .... Take 1 Tablet By Mouth Two Times A Day With Food For One Week, Then Take Two Times A Day As Needed For Pain (Please Write in Spanish) 5)  Proventil Hfa 108 (90 Base) Mcg/act Aers (Albuterol Sulfate) .Marland Kitchen.. 1-2 Puffs Q 4-6 Hours As Needed Cough, Wheezing (Please Write in Spanish) and Educate On Use of Inhaler 6)  Ibuprofen 800 Mg Tabs (Ibuprofen) .Marland Kitchen.. 1 Tab By Mouth Every 6 Hours As Needed For Sore Throat Pain--Take With Food 7)  Carafate 1 Gm Tabs (Sucralfate) .... One Tablet By Mouth Three Times A Day Before Meals For Stomach 8)  Magic Mouthwash .... (1 Part Viscous Lidocaine, 1 Part Maalox, 1 Part  Benadryl) Swish, Gargle and Spit 5 Ml Every 6-8 Hours As Needed For Throat Pain 9)  Loratadine 10 Mg Tabs (Loratadine) .Marland Kitchen.. 1 Tab By Mouth Daily 10)  Fluticasone Propionate 50 Mcg/act Susp (Fluticasone Propionate) .... 2 Sprays Each Nostril Once Daily As Needed Allergies 11)  Vitamin D 1000 Unit Tabs (Cholecalciferol) .... Take 1 Tablet By Mouth Once A Day  Allergies (verified): No Known Drug Allergies  Physical Exam  General:  NAD Eyes:  No corneal or conjunctival inflammation noted. EOMI. Perrla. Funduscopic exam benign, without hemorrhages, exudates or papilledema. Vision grossly normal. Mouth:  pharynx pink and moist.   Neck:  No deformities, masses, or tenderness noted. Chest Wall:  Exquisite left anterior and posterior chest wall tenderness.  Reproduces chest pain Lungs:  Normal respiratory effort, chest expands symmetrically. Lungs are clear to auscultation, no crackles or wheezes. Heart:  Normal rate and regular rhythm. S1 and S2 normal without gallop, murmur, click, rub or other extra sounds.  Radial pulses normal and equal Abdomen:  Mild epigastric and LUQ tenderness Extremities:  No edema Neurologic:  Good grip bilaterally Positive Tinels over both median nerve at palmar wrists.   Impression & Recommendations:  Problem # 1:  CHEST WALL PAIN, ACUTE (ICD-786.52) No heavy or repetative lifting--has also been coughing.  Her updated medication list for this problem includes:    Naprosyn 500 Mg Tabs (Naproxen) .Marland Kitchen... Take 1 tablet by mouth two times a day with food for one week, then take two times a day as needed for pain (please write in spanish)    Ibuprofen 800 Mg Tabs (Ibuprofen) .Marland Kitchen... 1 tab by mouth every 6 hours as needed for chest wall pain.  take only when you have protonix to take as well.  Problem # 2:  GERD (ICD-530.81) Feel cough may be related to this Her updated medication list for this problem includes:    Protonix 40 Mg Tbec (Pantoprazole sodium) .Marland Kitchen... Take 1  tablet by mouth two times a day for stomach.  do not run out or your symptoms will get worse (write in spanish)    Carafate 1 Gm Tabs (Sucralfate) ..... One tablet by mouth three times a day before meals for stomach  Problem # 3:  CARPAL TUNNEL SYNDROME, BILATERAL (ICD-354.0) Cock up splints at bedtime nightly  Complete Medication List: 1)  Benazepril Hcl 10 Mg Tabs (Benazepril hcl) .... Take 1 tablet by mouth once a day 2)  Levothyroxine Sodium 25 Mcg Tabs (Levothyroxine sodium) .Marland Kitchen.. 1 tablet by mouth daily for thyroid 3)  Protonix 40 Mg Tbec (Pantoprazole sodium) .... Take 1 tablet by mouth two times a day for stomach.  do  not run out or your symptoms will get worse (write in spanish) 4)  Naprosyn 500 Mg Tabs (Naproxen) .... Take 1 tablet by mouth two times a day with food for one week, then take two times a day as needed for pain (please write in spanish) 5)  Proventil Hfa 108 (90 Base) Mcg/act Aers (Albuterol sulfate) .Marland Kitchen.. 1-2 puffs q 4-6 hours as needed cough, wheezing (please write in spanish) and educate on use of inhaler 6)  Ibuprofen 800 Mg Tabs (Ibuprofen) .Marland Kitchen.. 1 tab by mouth every 6 hours as needed for chest wall pain.  take only when you have protonix to take as well. 7)  Carafate 1 Gm Tabs (Sucralfate) .... One tablet by mouth three times a day before meals for stomach 8)  Magic Mouthwash  .... (1 part viscous lidocaine, 1 part maalox, 1 part benadryl) swish, gargle and spit 5 ml every 6-8 hours as needed for throat pain 9)  Loratadine 10 Mg Tabs (Loratadine) .Marland Kitchen.. 1 tab by mouth daily 10)  Fluticasone Propionate 50 Mcg/act Susp (Fluticasone propionate) .... 2 sprays each nostril once daily as needed allergies 11)  Vitamin D 1000 Unit Tabs (Cholecalciferol) .... Take 1 tablet by mouth once a day  Other Orders: Flu Vaccine 44yrs + ([address]) Admin 1st Vaccine (60454)  Patient Instructions: 1)  Follow up with Dr. Delrae Alfred in 3 months --carpal tunnel syndrome 2)  Take ibuprofen only  with food.   3)  May take Prilosec OTC 1 cap two times a day until can get Protonix Prescriptions: PROTONIX 40 MG  TBEC (PANTOPRAZOLE SODIUM) Take 1 tablet by mouth two times a day for stomach.  Do not run out or your symptoms will get worse (write in Spanish)  #60 x 11   Entered and Authorized by:   Julieanne Manson MD   Signed by:   Julieanne Manson MD on 06/02/2010   Method used:   Faxed to ...       [facility] (retail)       [address] [place], [place]  [address]       Ph: [phone]       Fax: [phone]   RxID:   [id number] IBUPROFEN 800 MG TABS (IBUPROFEN) 1 tab by mouth every 6 hours as needed for Chest wall pain.  Take only when you have Protonix to take as well.  #60 x 0   Entered and Authorized by:   Julieanne Manson MD   Signed by:   Julieanne Manson MD on 06/02/2010   Method used:   Faxed to ...       [facility] (retail)       [address] [place], [place]  [address]       Ph: [phone]       Fax: [phone]   RxID:   [id number]    Orders Added: 1)  Flu Vaccine 47yrs + [90658] 2)  Admin 1st Vaccine [90471] 3)  Est. Patient Level IV [16010]   Immunizations Administered:  Influenza Vaccine # 1:    Vaccine Type: Fluvax 3+    Site: left deltoid    Mfr: GlaxoSmithKline    Dose: 0.5 ml    Route: IM    Given by: Hale Drone CMA    Exp. Date: 11/25/2010    Lot #: [id number]    VIS given: 12/20/09 version given June 02, 2010.  Flu Vaccine Consent Questions:    Do you have a history of severe allergic reactions to this vaccine? no    Any prior history of allergic reactions to egg and/or gelatin? no    Do you have a sensitivity to the preservative Thimersol? no    Do you have a past history of Guillan-Barre Syndrome? no    Do you currently have an acute febrile illness? no    Have you ever had a severe reaction to latex? no    Vaccine  information given and explained to patient? yes    Are you currently pregnant? no   Immunizations Administered:  Influenza Vaccine # 1:    Vaccine Type: Fluvax 3+    Site: left deltoid    Mfr: GlaxoSmithKline    Dose: 0.5 ml    Route: IM    Given by: Hale Drone CMA    Exp. Date: 11/25/2010    Lot #: [id number]    VIS given: 12/20/09 version given June 02, 2010.

## 2010-07-09 ENCOUNTER — Emergency Department (HOSPITAL_COMMUNITY)
Admission: EM | Admit: 2010-07-09 | Discharge: 2010-07-09 | Disposition: A | Discharge status: Home / Self Care | Payer: Self-pay | Attending: Emergency Medicine | Admitting: Emergency Medicine

## 2010-07-09 ENCOUNTER — Emergency Department (HOSPITAL_COMMUNITY): Payer: Self-pay

## 2010-07-09 DIAGNOSIS — J3489 Other specified disorders of nose and nasal sinuses: Secondary | ICD-10-CM | POA: Insufficient documentation

## 2010-07-09 DIAGNOSIS — R10819 Abdominal tenderness, unspecified site: Secondary | ICD-10-CM | POA: Insufficient documentation

## 2010-07-09 DIAGNOSIS — E039 Hypothyroidism, unspecified: Secondary | ICD-10-CM | POA: Insufficient documentation

## 2010-07-09 DIAGNOSIS — Z79899 Other long term (current) drug therapy: Secondary | ICD-10-CM | POA: Insufficient documentation

## 2010-07-09 DIAGNOSIS — I10 Essential (primary) hypertension: Secondary | ICD-10-CM | POA: Insufficient documentation

## 2010-07-09 DIAGNOSIS — R109 Unspecified abdominal pain: Secondary | ICD-10-CM | POA: Insufficient documentation

## 2010-07-09 DIAGNOSIS — H9209 Otalgia, unspecified ear: Secondary | ICD-10-CM | POA: Insufficient documentation

## 2010-07-09 DIAGNOSIS — R071 Chest pain on breathing: Secondary | ICD-10-CM | POA: Insufficient documentation

## 2010-07-09 LAB — DIFFERENTIAL
Basophils Relative: 0 % (ref 0–1)
Eosinophils Absolute: 0 10*3/uL (ref 0.0–0.7)
Neutrophils Relative %: 46 % (ref 43–77)

## 2010-07-09 LAB — POCT CARDIAC MARKERS
CKMB, poc: <1 ng/mL — ABNORMAL LOW (ref 1.0–8.0)
CKMB, poc: <1 ng/mL — ABNORMAL LOW (ref 1.0–8.0)
Myoglobin, poc: 29.1 ng/mL (ref 12–200)
Troponin i, poc: <0.05 ng/mL (ref 0.00–0.09)
Troponin i, poc: <0.05 ng/mL (ref 0.00–0.09)

## 2010-07-09 LAB — HEPATIC FUNCTION PANEL
ALT: 22 U/L (ref 0–35)
AST: 22 U/L (ref 0–37)
Albumin: 3.8 g/dL (ref 3.5–5.2)
Bilirubin, Direct: 0.1 mg/dL (ref 0.0–0.3)

## 2010-07-09 LAB — URINALYSIS, ROUTINE W REFLEX MICROSCOPIC
Bilirubin Urine: NEGATIVE
Ketones, ur: NEGATIVE mg/dL
Nitrite: NEGATIVE
Urobilinogen, UA: 0.2 mg/dL (ref 0.0–1.0)
pH: 6.5 (ref 5.0–8.0)

## 2010-07-09 LAB — TROPONIN I: Troponin I: 0.01 ng/mL (ref 0.00–0.06)

## 2010-07-09 LAB — CK TOTAL AND CKMB (NOT AT ARMC): Relative Index: INVALID (ref 0.0–2.5)

## 2010-07-09 LAB — CBC
Platelets: 303 10*3/uL (ref 150–400)
RBC: 4.6 MIL/uL (ref 3.87–5.11)
WBC: 7.2 10*3/uL (ref 4.0–10.5)

## 2010-07-09 LAB — URINE MICROSCOPIC-ADD ON

## 2010-07-09 LAB — POCT I-STAT, CHEM 8
BUN: 14 mg/dL (ref 6–23)
TCO2: 23 mmol/L (ref 0–100)

## 2010-07-09 LAB — POCT PREGNANCY, URINE: Preg Test, Ur: NEGATIVE

## 2010-07-10 ENCOUNTER — Telehealth (INDEPENDENT_AMBULATORY_CARE_PROVIDER_SITE_OTHER): Payer: Self-pay | Admitting: Internal Medicine

## 2010-07-20 ENCOUNTER — Encounter: Payer: Self-pay | Admitting: Internal Medicine

## 2010-07-20 ENCOUNTER — Telehealth (INDEPENDENT_AMBULATORY_CARE_PROVIDER_SITE_OTHER): Payer: Self-pay | Admitting: Internal Medicine

## 2010-07-20 ENCOUNTER — Encounter (INDEPENDENT_AMBULATORY_CARE_PROVIDER_SITE_OTHER): Payer: Self-pay | Admitting: Internal Medicine

## 2010-07-25 NOTE — Progress Notes (Signed)
Summary: COLD & LEFT EAR PAIN   Phone Note Call from Patient   Complaint: Cough/Sore throat, Earache/Ear Infection Summary of Call: DR Kamyla Olejnik SPANISH PT CALLED SHE IS BEING HAVING COLD SINCE TUESDAY AND LEFT EAR PAIN AND SHE IS TAKING NYQUILT AND PT STATES THAT SHE IS HIGH BP . Fuller Song HER @ [id number] OR CELL [phone] [facility] YOU  Initial call taken by: Cheryll Dessert,  July 20, 2010 9:51 AM  Follow-up for Phone Call        Per Arna Medici as interpreter -- pt. states has been having ear pain for over a year -- went to [facility] two weeks ago, they didn't address her ear pain.  Continues to be a problem. Appt. scheduled with Dr. Delrae Alfred for this morning. Follow-up by: Dutch Quint RN,  July 20, 2010 10:08 AM

## 2010-07-25 NOTE — Progress Notes (Addendum)
Summary: REF TO HEART & VASCULAR  Phone Note Call from Patient Call back at Home Phone [phone]   Reason for Call: Referral Summary of Call: Candace Rodgers OT. MS ALVAREZ WENT TO [facility] YESTERDAY FOR CHEST PAINS AND THEY REFERRED HER TO HEART AND VASCULAR TODAY AT 9, BUT WHEN THEY GOT THERE THEY WERE TOLD THERE WAS NO APPT.  I DID SCHEDULE HER TO SEE YOU FOR 02/28 BUT ALSO TOLD HER THAT SINCE SHE WAS REFERRED FOR STRESS TEST THAT YOU WOULD PROBABLY GET IT SET UP FOR HER. Initial call taken by: Leodis Rains,  July 10, 2010 10:31 AM  Follow-up for Phone Call        Sent to [facility] -- ED records in your refill rack.  Dutch Quint RN  July 10, 2010 2:25 PM   Additional Follow-up for Phone Call Additional follow up Details #1::        Gerilyn Nestle Please call and speak with pt. I do not believe her chest pain is from her heart as documened in my note. Her ED record shows she ruled out for MI and her symptoms were not supportive of a cardiac etiology. I would like to see if she picked up her Protonix and got restarted--if so, when did she restart.  Let her know it may take a week or 2 for her to feel better. Tell her not to press on her chest. Have her completely stop the Ibuprofen as well. Additional Follow-up by: Julieanne Manson MD,  July 19, 2010 12:05 PM    Additional Follow-up for Phone Call Additional follow up Details #2::    Here today--see ov Follow-up by: Julieanne Manson MD,  July 20, 2010 12:15 PM

## 2010-07-25 NOTE — Assessment & Plan Note (Signed)
Summary: left ear pain and cold   Vital Signs:  Patient profile:   45 year old female Menstrual status:  irregular LMP:     06/12/2010 Weight:      202.19 pounds Temp:     97.6 degrees F oral Pulse rate:   60 / minute Pulse rhythm:   regular Resp:     20 per minute BP sitting:   164 / 112  (left arm) Cuff size:   regular  Vitals Entered By: Hale Drone CMA (July 20, 2010 11:26 AM) CC: Hosp f/u for chest pains. Cold x2 days. Right ear pain. Congested w/cough. Itchy throat.  Is Patient Diabetic? No Pain Assessment Patient in pain? yes       Does patient need assistance? Functional Status Self care Ambulation Normal LMP (date): 06/12/2010 LMP - Character: normal     Enter LMP: 06/12/2010 Last PAP Result NEGATIVE FOR INTRAEPITHELIAL LESIONS OR MALIGNANCY.   Primary Care Provider:  Tereso Newcomer PA-C  CC:  Hosp f/u for chest pains. Cold x2 days. Right ear pain. Congested w/cough. Itchy throat. Marland Kitchen  History of Present Illness: 1.  For 2 days, nasal and head congestion, sore throat, right ear pain.  Lots of coughing, nonproductive, clearing throat.  No fever.  Having drainage down the back of her throat.  Is having myalgias.  Her older son and husband have had similar symptoms.  Has not taken anything over the counter except Nyquil.  Does help a little.  2.  Chest Pain:  see previous phone note--went to ED--ruled out MI.  Again sounded more chest wall related and GI related.  Pt. states her symptoms have improved since restarted Protonix.  Does note the ibuprofen seems to exacerbate symptoms.    Current Medications (verified): 1)  Benazepril Hcl 10 Mg Tabs (Benazepril Hcl) .... Take 1 Tablet By Mouth Once A Day 2)  Levothyroxine Sodium 25 Mcg  Tabs (Levothyroxine Sodium) .Marland Kitchen.. 1 Tablet By Mouth Daily For Thyroid 3)  Protonix 40 Mg  Tbec (Pantoprazole Sodium) .... Take 1 Tablet By Mouth Two Times A Day For Stomach.  Do Not Run Out or Your Symptoms Will Get Worse (Write in  Spanish) 4)  Naprosyn 500 Mg Tabs (Naproxen) .... Take 1 Tablet By Mouth Two Times A Day With Food For One Week, Then Take Two Times A Day As Needed For Pain (Please Write in Spanish) 5)  Proventil Hfa 108 (90 Base) Mcg/act Aers (Albuterol Sulfate) .Marland Kitchen.. 1-2 Puffs Q 4-6 Hours As Needed Cough, Wheezing (Please Write in Spanish) and Educate On Use of Inhaler 6)  Ibuprofen 800 Mg Tabs (Ibuprofen) .Marland Kitchen.. 1 Tab By Mouth Every 6 Hours As Needed For Chest Wall Pain.  Take Only When You Have Protonix To Take As Well. 7)  Carafate 1 Gm Tabs (Sucralfate) .... One Tablet By Mouth Three Times A Day Before Meals For Stomach 8)  Magic Mouthwash .... (1 Part Viscous Lidocaine, 1 Part Maalox, 1 Part Benadryl) Swish, Gargle and Spit 5 Ml Every 6-8 Hours As Needed For Throat Pain 9)  Loratadine 10 Mg Tabs (Loratadine) .Marland Kitchen.. 1 Tab By Mouth Daily 10)  Fluticasone Propionate 50 Mcg/act Susp (Fluticasone Propionate) .... 2 Sprays Each Nostril Once Daily As Needed Allergies 11)  Vitamin D 1000 Unit Tabs (Cholecalciferol) .... Take 1 Tablet By Mouth Once A Day  Allergies (verified): No Known Drug Allergies  Physical Exam  General:  Obese, NAD--obvious nasal congestion Eyes:  No corneal or conjunctival inflammation noted. EOMI. Perrla. Funduscopic  exam benign, without hemorrhages, exudates or papilledema. Vision grossly normal. Ears:  TMs dull bilaterally, but no erythems, bulging or retraction Nose:  Nasal mucosa erythematous and swollen--especially on right. Mouth:  Throat with mild injection, no exudate Neck:  No deformities, masses, or tenderness noted.  No adenopathy Lungs:  Normal respiratory effort, chest expands symmetrically. Lungs are clear to auscultation, no crackles or wheezes. Heart:  Normal rate and regular rhythm. S1 and S2 normal without gallop, murmur, click, rub or other extra sounds.   Impression & Recommendations:  Problem # 1:  URI (ICD-465.9) Supportive care Her updated medication list for  this problem includes:    Naprosyn 500 Mg Tabs (Naproxen) .Marland Kitchen... Take 1 tablet by mouth two times a day with food for one week, then take two times a day as needed for pain (please write in spanish)    Ibuprofen 800 Mg Tabs (Ibuprofen) .Marland Kitchen... 1 tab by mouth every 6 hours as needed for chest wall pain.  take only when you have protonix to take as well.    Loratadine 10 Mg Tabs (Loratadine) .Marland Kitchen... 1 tab by mouth daily  Problem # 2:  CHEST WALL PAIN, ACUTE (ICD-786.52) Stop NSAIDs Discussed noncardiac Her updated medication list for this problem includes:    Naprosyn 500 Mg Tabs (Naproxen) .Marland Kitchen... Take 1 tablet by mouth two times a day with food for one week, then take two times a day as needed for pain (please write in spanish)    Ibuprofen 800 Mg Tabs (Ibuprofen) .Marland Kitchen... 1 tab by mouth every 6 hours as needed for chest wall pain.  take only when you have protonix to take as well.  Problem # 3:  GERD (ICD-530.81) Stop NSAIDS Discussed part cause of chest and abdominal complaints Her updated medication list for this problem includes:    Protonix 40 Mg Tbec (Pantoprazole sodium) .Marland Kitchen... Take 1 tablet by mouth two times a day for stomach.  do not run out or your symptoms will get worse (write in spanish)    Carafate 1 Gm Tabs (Sucralfate) ..... One tablet by mouth three times a day before meals for stomach  Complete Medication List: 1)  Benazepril Hcl 10 Mg Tabs (Benazepril hcl) .... Take 1 tablet by mouth once a day 2)  Levothyroxine Sodium 25 Mcg Tabs (Levothyroxine sodium) .Marland Kitchen.. 1 tablet by mouth daily for thyroid 3)  Protonix 40 Mg Tbec (Pantoprazole sodium) .... Take 1 tablet by mouth two times a day for stomach.  do not run out or your symptoms will get worse (write in spanish) 4)  Naprosyn 500 Mg Tabs (Naproxen) .... Take 1 tablet by mouth two times a day with food for one week, then take two times a day as needed for pain (please write in spanish) 5)  Proventil Hfa 108 (90 Base) Mcg/act Aers  (Albuterol sulfate) .Marland Kitchen.. 1-2 puffs q 4-6 hours as needed cough, wheezing (please write in spanish) and educate on use of inhaler 6)  Ibuprofen 800 Mg Tabs (Ibuprofen) .Marland Kitchen.. 1 tab by mouth every 6 hours as needed for chest wall pain.  take only when you have protonix to take as well. 7)  Carafate 1 Gm Tabs (Sucralfate) .... One tablet by mouth three times a day before meals for stomach 8)  Magic Mouthwash  .... (1 part viscous lidocaine, 1 part maalox, 1 part benadryl) swish, gargle and spit 5 ml every 6-8 hours as needed for throat pain 9)  Loratadine 10 Mg Tabs (Loratadine) .Marland Kitchen.. 1 tab by  mouth daily 10)  Fluticasone Propionate 50 Mcg/act Susp (Fluticasone propionate) .... 2 sprays each nostril once daily as needed allergies 11)  Vitamin D 1000 Unit Tabs (Cholecalciferol) .... Take 1 tablet by mouth once a day  Patient Instructions: 1)  Coricidin HBP--take for congestion 2)  Tylenol Extra Strength 500 mg--2 tabs up to 3 times daily for ear pain, sore throat and muscle aches 3)  Nasal saline--find in cold remedy area of pharmacy--may use as much as helps 4)  Cool mist humidifier or vaporizer in room while fighting cold   Orders Added: 1)  Est. Patient Level III [16109]

## 2010-08-04 ENCOUNTER — Encounter: Payer: Self-pay | Admitting: Internal Medicine

## 2010-08-04 ENCOUNTER — Encounter (INDEPENDENT_AMBULATORY_CARE_PROVIDER_SITE_OTHER): Payer: Self-pay | Admitting: Internal Medicine

## 2010-08-08 NOTE — Assessment & Plan Note (Signed)
Summary: Chest pains   Vital Signs:  Patient profile:   45 year old female Menstrual status:  irregular LMP:     06/06/2010 Weight:      202.06 pounds Temp:     98.2 degrees F oral Pulse rate:   72 / minute Pulse rhythm:   regular Resp:     18 per minute BP sitting:   125 / 79  (left arm) Cuff size:   regular  Vitals Entered By: Hale Drone CMA (August 04, 2010 3:50 PM) CC: Hosp. f/u for chest pain. Feels better and has not had the pain since hosp visit.... Still having right ear pain and would like some medication. Is Patient Diabetic? No Pain Assessment Patient in pain? no       Does patient need assistance? Functional Status Self care Ambulation Normal LMP (date): 06/06/2010 LMP - Character: normal     Enter LMP: 06/06/2010 Last PAP Result NEGATIVE FOR INTRAEPITHELIAL LESIONS OR MALIGNANCY.   Primary Care Provider:  Tereso Newcomer PA-C  CC:  Hosp. f/u for chest pain. Feels better and has not had the pain since hosp visit.... Still having right ear pain and would like some medication.Marland Kitchen  History of Present Illness: 1.  Chest Pain:  no further chest pain  2.  GERD:  Feeling much better.  Stopped NSAIDS.  Only taking Protonix now only--still at two times a day dosing.  Cough is still the same, however.  3.  Cough:  Worse at night--dry , but also having drainage down posterior pharynx.  Not outside much.  Has never had problems in Spring with allergies.  Has not noted runny nose or significant sneezing.  No itchy, watery eyes.  Current Medications (verified): 1)  Benazepril Hcl 10 Mg Tabs (Benazepril Hcl) .... Take 1 Tablet By Mouth Once A Day 2)  Levothyroxine Sodium 25 Mcg  Tabs (Levothyroxine Sodium) .Marland Kitchen.. 1 Tablet By Mouth Daily For Thyroid 3)  Protonix 40 Mg  Tbec (Pantoprazole Sodium) .... Take 1 Tablet By Mouth Two Times A Day For Stomach.  Do Not Run Out or Your Symptoms Will Get Worse (Write in Spanish) 4)  Proventil Hfa 108 (90 Base) Mcg/act Aers (Albuterol  Sulfate) .Marland Kitchen.. 1-2 Puffs Q 4-6 Hours As Needed Cough, Wheezing (Please Write in Spanish) and Educate On Use of Inhaler 5)  Carafate 1 Gm Tabs (Sucralfate) .... One Tablet By Mouth Three Times A Day Before Meals For Stomach 6)  Magic Mouthwash .... (1 Part Viscous Lidocaine, 1 Part Maalox, 1 Part Benadryl) Swish, Gargle and Spit 5 Ml Every 6-8 Hours As Needed For Throat Pain 7)  Loratadine 10 Mg Tabs (Loratadine) .Marland Kitchen.. 1 Tab By Mouth Daily 8)  Fluticasone Propionate 50 Mcg/act Susp (Fluticasone Propionate) .... 2 Sprays Each Nostril Once Daily As Needed Allergies 9)  Vitamin D 1000 Unit Tabs (Cholecalciferol) .... Take 1 Tablet By Mouth Once A Day  Allergies (verified): No Known Drug Allergies  Physical Exam  Lungs:  Normal respiratory effort, chest expands symmetrically. Lungs are clear to auscultation, no crackles or wheezes. Heart:  Normal rate and regular rhythm. S1 and S2 normal without gallop, murmur, click, rub or other extra sounds. Abdomen:  soft and normal bowel sounds.  Mild epigastric tenderness.   Impression & Recommendations:  Problem # 1:  COUGH (ICD-786.2) suspect at this point and ACE I cough--switch to Cozaar  Problem # 2:  CHEST WALL PAIN, ACUTE (ICD-786.52) resolved  Problem # 3:  GERD (ICD-530.81) Controlled--to elevat HOB, however  Her updated medication list for this problem includes:    Protonix 40 Mg Tbec (Pantoprazole sodium) .Marland Kitchen... Take 1 tablet by mouth two times a day for stomach.  do not run out or your symptoms will get worse (write in spanish)    Carafate 1 Gm Tabs (Sucralfate) ..... One tablet by mouth three times a day before meals for stomach  Complete Medication List: 1)  Losartan Potassium 50 Mg Tabs (Losartan potassium) .Marland Kitchen.. 1 tab by mouth daily instead of benazepril 2)  Levothyroxine Sodium 25 Mcg Tabs (Levothyroxine sodium) .Marland Kitchen.. 1 tablet by mouth daily for thyroid 3)  Protonix 40 Mg Tbec (Pantoprazole sodium) .... Take 1 tablet by mouth two times a  day for stomach.  do not run out or your symptoms will get worse (write in spanish) 4)  Proventil Hfa 108 (90 Base) Mcg/act Aers (Albuterol sulfate) .Marland Kitchen.. 1-2 puffs q 4-6 hours as needed cough, wheezing (please write in spanish) and educate on use of inhaler 5)  Carafate 1 Gm Tabs (Sucralfate) .... One tablet by mouth three times a day before meals for stomach 6)  Magic Mouthwash  .... (1 part viscous lidocaine, 1 part maalox, 1 part benadryl) swish, gargle and spit 5 ml every 6-8 hours as needed for throat pain 7)  Loratadine 10 Mg Tabs (Loratadine) .Marland Kitchen.. 1 tab by mouth daily 8)  Fluticasone Propionate 50 Mcg/act Susp (Fluticasone propionate) .... 2 sprays each nostril once daily as needed allergies 9)  Vitamin D 1000 Unit Tabs (Cholecalciferol) .... Take 1 tablet by mouth once a day  Patient Instructions: 1)  Follow up with Dr. Delrae Alfred in 1 month --cough and bp Prescriptions: FLUTICASONE PROPIONATE 50 MCG/ACT SUSP (FLUTICASONE PROPIONATE) 2 sprays each nostril once daily as needed allergies  #1 x 11   Entered and Authorized by:   Julieanne Manson MD   Signed by:   Julieanne Manson MD on 08/04/2010   Method used:   Faxed to ...       [facility] (retail)       [address] [place], [place]  [address]       Ph: [phone]       Fax: [phone]   RxID:   [id number] LORATADINE 10 MG TABS (LORATADINE) 1 tab by mouth daily  #30 x 11   Entered and Authorized by:   Julieanne Manson MD   Signed by:   Julieanne Manson MD on 08/04/2010   Method used:   Faxed to ...       [facility] (retail)       [address] [place], [place]  [address]       Ph: [phone]       Fax: [phone]   RxID:   [id number] LOSARTAN POTASSIUM 50 MG TABS (LOSARTAN POTASSIUM) 1 tab by mouth daily instead of Benazepril  #30 x 6   Entered and Authorized by:   Julieanne Manson MD   Signed by:    Julieanne Manson MD on 08/04/2010   Method used:   Faxed to ...       [facility] (retail)       [address] [place], [place]  [address]       Ph: [phone]       Fax: [phone]   RxID:   [id number]  Orders Added: 1)  Est. Patient Level IV [id number]

## 2010-08-31 ENCOUNTER — Other Ambulatory Visit (HOSPITAL_COMMUNITY): Payer: Self-pay | Admitting: Internal Medicine

## 2010-08-31 DIAGNOSIS — Z1231 Encounter for screening mammogram for malignant neoplasm of breast: Secondary | ICD-10-CM

## 2010-09-05 ENCOUNTER — Ambulatory Visit (HOSPITAL_COMMUNITY): Payer: Self-pay

## 2010-09-05 LAB — URINALYSIS, ROUTINE W REFLEX MICROSCOPIC
Bilirubin Urine: NEGATIVE
Glucose, UA: NEGATIVE mg/dL
Specific Gravity, Urine: 1.01 (ref 1.005–1.030)
pH: 6.5 (ref 5.0–8.0)

## 2010-09-05 LAB — URINE MICROSCOPIC-ADD ON

## 2010-09-13 ENCOUNTER — Ambulatory Visit (HOSPITAL_COMMUNITY): Payer: Self-pay

## 2010-09-16 ENCOUNTER — Emergency Department (HOSPITAL_COMMUNITY)
Admission: EM | Admit: 2010-09-16 | Discharge: 2010-09-17 | Disposition: A | Discharge status: Home / Self Care | Payer: Self-pay | Attending: Emergency Medicine | Admitting: Emergency Medicine

## 2010-09-16 DIAGNOSIS — R1013 Epigastric pain: Secondary | ICD-10-CM | POA: Insufficient documentation

## 2010-09-16 DIAGNOSIS — R110 Nausea: Secondary | ICD-10-CM | POA: Insufficient documentation

## 2010-09-16 DIAGNOSIS — I10 Essential (primary) hypertension: Secondary | ICD-10-CM | POA: Insufficient documentation

## 2010-09-16 DIAGNOSIS — E039 Hypothyroidism, unspecified: Secondary | ICD-10-CM | POA: Insufficient documentation

## 2010-09-16 DIAGNOSIS — Z79899 Other long term (current) drug therapy: Secondary | ICD-10-CM | POA: Insufficient documentation

## 2010-09-16 LAB — COMPREHENSIVE METABOLIC PANEL
AST: 23 U/L (ref 0–37)
BUN: 8 mg/dL (ref 6–23)
CO2: 25 mEq/L (ref 19–32)
Calcium: 8.6 mg/dL (ref 8.4–10.5)
Chloride: 104 mEq/L (ref 96–112)
Creatinine, Ser: 0.58 mg/dL (ref 0.4–1.2)
GFR calc Af Amer: >60 mL/min (ref >60)
GFR calc non Af Amer: >60 mL/min (ref >60)
Total Bilirubin: 0.2 mg/dL — ABNORMAL LOW (ref 0.3–1.2)

## 2010-09-16 LAB — URINALYSIS, ROUTINE W REFLEX MICROSCOPIC
Nitrite: NEGATIVE
Protein, ur: NEGATIVE mg/dL
Specific Gravity, Urine: 1.002 — ABNORMAL LOW (ref 1.005–1.030)
Urobilinogen, UA: 0.2 mg/dL (ref 0.0–1.0)

## 2010-09-16 LAB — DIFFERENTIAL
Basophils Relative: 0 % (ref 0–1)
Eosinophils Absolute: 0 10*3/uL (ref 0.0–0.7)
Lymphs Abs: 3.7 10*3/uL (ref 0.7–4.0)
Monocytes Absolute: 0.9 10*3/uL (ref 0.1–1.0)
Monocytes Relative: 9 % (ref 3–12)

## 2010-09-16 LAB — URINE MICROSCOPIC-ADD ON

## 2010-09-16 LAB — CBC
Hemoglobin: 12.4 g/dL (ref 12.0–15.0)
MCH: 27.7 pg (ref 26.0–34.0)
MCHC: 33.1 g/dL (ref 30.0–36.0)
MCV: 83.7 fL (ref 78.0–100.0)
Platelets: 288 10*3/uL (ref 150–400)
RBC: 4.48 MIL/uL (ref 3.87–5.11)

## 2010-09-17 ENCOUNTER — Emergency Department (HOSPITAL_COMMUNITY): Payer: Self-pay

## 2010-10-13 NOTE — Op Note (Signed)
Candace Rodgers, Candace Rodgers          ACCOUNT NO.:  [id number]   MEDICAL RECORD NO.:  [id number]          PATIENT TYPE:  AMB   LOCATION:  SDC                           FACILITY:  [facility]   PHYSICIAN:  Tanya S. Shawnie Pons, M.D.   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   PROCEDURE:  Cold knife cone.   SURGEON:  Tinnie Gens, M.D.   ASSISTANT:  Ellis Parents, M.D.   ANESTHESIA:  MAC.   SPECIMENS:  Cone.   ESTIMATED BLOOD LOSS:  100 mL.   COMPLICATIONS:  None.   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 She  has had four colposcopies by four different examining physicians and has  never had an agreement between the two. In fact, approximately 1 year ago  she was seen by Dr. Serita Kyle who performed a colposcopy and looked  at the vagina as well as the cervix, could find no agreement, and decided  she needed to go for cold knife cone. However, the patient failed to follow  up and her most recent Pap smear again showed CIN-3 so she was brought in  for cold knife conization.   DESCRIPTION OF PROCEDURE:  The patient was taken to the OR where she was  placed in the dorsal lithotomy in Lockhart stirrups. She was draped in the  regular fashion and a Graves speculum was used to identify the cervix. The  cervix was grasped with a single tooth tenaculum from 2 to 4 o'clock and a  suture was placed behind this. Similarly, the cervix was grasped from 8 to  10 o'clock and a suture placed behind this as well. A conization was  performed using the knife. The specimen was removed.  Then, a 0 Vicryl suture  was used for a pursestring stitch around the  outside of the cervix. This was tied down tightly and hemostasis was  obtained.  All instruments were then removed from the vagina. The patient  was awakened and taken to the recovery room in stable condition. All  instrument, needle and lap counts were correct x2.      TSP/MEDQ  D:  09/04/2004  T:  09/04/2004  Job:  [id number]

## 2010-10-13 NOTE — Consult Note (Signed)
[name], [name]                      ACCOUNT NO.:  [id number]   MEDICAL RECORD NO.:  [id number]                   PATIENT TYPE:  OUT   LOCATION:  GYN                                  FACILITY:  [facility]   PHYSICIAN:  De Blanch, M.D.         DATE OF BIRTH:  1965-11-18   DATE OF CONSULTATION:  10/14/2002  DATE OF DISCHARGE:                                   CONSULTATION   HISTORY OF PRESENT ILLNESS:  A 45 year old Hispanic female seen in  consultation at the request of Tanya S. Shawnie Pons, M.D. regarding management of  a Pap smear showing high grade squamous intraepithelial lesion.   The patient has had records faxed to [place] indicating that she has had a high  grade Pap smear at least since December 2001.  In November 2001 she had  colposcopy with biopsies showing CIN I.  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  In November 2003 and January 2004 she had  a high grade SIL Pap smear.  Biopsies on March 26 were negative.   The patient denies any spotting, pelvic pain, pressure, GI, or GU symptoms.   PAST OBSTETRICAL HISTORY:  Gravida 4.   GYN HISTORY:  The patient is not using any contraception.  She has somewhat  irregular menstrual periods, but no spotting or menorrhagia.   PAST MEDICAL HISTORY:  None.   PAST SURGICAL HISTORY:  None.   ALLERGIES:  None.   CURRENT MEDICATIONS:  None.   PHYSICAL EXAMINATION:  VITAL SIGNS:  Height 5 feet 1 inches, weight 195  pounds, blood pressure 136/86, pulse 88, respiratory rate 24.  GENERAL:  The patient is a healthy Hispanic female in no acute distress.  She comes accompanied by her 44-year-old child who cried and screamed  throughout the entire office visit.  She also came accompanied with a  Engineer, structural.  PELVIC:  EGBUS, vagina, bladder, urethra are normal.  Cervix is parous.  Cervix is normal.  Uterus is anterior, normal shape, size, consistency.   PROCEDURE NOTE:  Colposcopic  examination is performed of the cervix and  vagina.  The squamocolumnar junction is seen and there is some faint white  epithelium at the squamocolumnar junction.  The entire vagina is visualized  and I cannot see any abnormalities.    IMPRESSION:  High grade squamous intraepithelial lesion.  I would recommend  that the patient undergo a cold knife conization and endocervical curettage  for further evaluation.  I contacted Shelbie Proctor. Shawnie Pons, M.D. and she will  arrange for the patient to have this done at [facility] under her  guidance.                                               De Blanch, M.D.    DC/MEDQ  D:  10/14/2002  T:  10/14/2002  Job:  [id number]   cc:   Shelbie Proctor. Shawnie Pons, M.D.  [address]  [place], [place] [address]  Fax: [phone]   Telford Nab, R.N.

## 2014-03-04 ENCOUNTER — Other Ambulatory Visit (HOSPITAL_COMMUNITY): Payer: Self-pay | Admitting: Family Medicine

## 2014-03-04 DIAGNOSIS — Z1231 Encounter for screening mammogram for malignant neoplasm of breast: Secondary | ICD-10-CM

## 2014-03-24 ENCOUNTER — Ambulatory Visit (HOSPITAL_COMMUNITY)
Admission: RE | Admit: 2014-03-24 | Discharge: 2014-03-24 | Disposition: A | Discharge status: Home / Self Care | Payer: Self-pay | Source: Ambulatory Visit | Attending: Family Medicine | Admitting: Family Medicine

## 2014-03-24 DIAGNOSIS — Z1231 Encounter for screening mammogram for malignant neoplasm of breast: Secondary | ICD-10-CM

## 2017-06-21 ENCOUNTER — Ambulatory Visit: Payer: Self-pay | Attending: Internal Medicine | Admitting: *Deleted

## 2019-09-19 ENCOUNTER — Ambulatory Visit: Payer: Self-pay

## 2021-05-15 NOTE — Progress Notes (Signed)
This encounter was created in error - please disregard. This encounter was created in error - please disregard. This encounter was created in error - please disregard. 

## 2023-11-13 ENCOUNTER — Other Ambulatory Visit: Payer: Self-pay | Admitting: Internal Medicine

## 2024-01-21 ENCOUNTER — Encounter (HOSPITAL_COMMUNITY): Payer: Self-pay | Admitting: Internal Medicine

## 2024-01-21 NOTE — Progress Notes (Signed)
  PCP-Victoria Beamer MD Cardiologist-n/a Pulmonologist-n/a    EKG-n/a Echo-n/a Cath-n/a Stress-n/a ICD/PM- n/a GLP1-n/a Blood Thinner-n/a  History: HTN, Hypothyroidism, DM2. Patient presented to ED 8/8 for facial droop and eye twitching. She was found to have otis media and based on the scans lymphadenopathy. Patient stated they put her on antibiotics and since then has resolved and they told her some of the swelling was causing the facial issues but is better now.  She went to see her pcp on 8/15 for f/u of this. She indicates no cardiac or breathing issues, no equipment.  Anesthesia Review- yes- okay to proceed

## 2024-01-28 ENCOUNTER — Encounter (HOSPITAL_COMMUNITY): Payer: Self-pay | Admitting: Internal Medicine

## 2024-01-28 ENCOUNTER — Ambulatory Visit (HOSPITAL_COMMUNITY)
Admission: RE | Admit: 2024-01-28 | Discharge: 2024-01-28 | Disposition: A | Discharge status: Home / Self Care | Payer: Self-pay | Attending: Internal Medicine | Admitting: Internal Medicine

## 2024-01-28 ENCOUNTER — Other Ambulatory Visit: Payer: Self-pay

## 2024-01-28 ENCOUNTER — Ambulatory Visit (HOSPITAL_COMMUNITY): Payer: Self-pay | Admitting: Certified Registered"

## 2024-01-28 ENCOUNTER — Encounter (HOSPITAL_COMMUNITY)
Admission: RE | Disposition: A | Discharge status: Home / Self Care | Payer: Self-pay | Source: Home / Self Care | Attending: Internal Medicine

## 2024-01-28 DIAGNOSIS — Z1211 Encounter for screening for malignant neoplasm of colon: Secondary | ICD-10-CM | POA: Insufficient documentation

## 2024-01-28 DIAGNOSIS — R195 Other fecal abnormalities: Secondary | ICD-10-CM | POA: Insufficient documentation

## 2024-01-28 DIAGNOSIS — I10 Essential (primary) hypertension: Secondary | ICD-10-CM

## 2024-01-28 DIAGNOSIS — K644 Residual hemorrhoidal skin tags: Secondary | ICD-10-CM | POA: Insufficient documentation

## 2024-01-28 DIAGNOSIS — D122 Benign neoplasm of ascending colon: Secondary | ICD-10-CM | POA: Insufficient documentation

## 2024-01-28 DIAGNOSIS — E039 Hypothyroidism, unspecified: Secondary | ICD-10-CM

## 2024-01-28 DIAGNOSIS — K5730 Diverticulosis of large intestine without perforation or abscess without bleeding: Secondary | ICD-10-CM | POA: Insufficient documentation

## 2024-01-28 DIAGNOSIS — K635 Polyp of colon: Secondary | ICD-10-CM

## 2024-01-28 HISTORY — DX: Type 2 diabetes mellitus without complications: E11.9

## 2024-01-28 HISTORY — DX: Essential (primary) hypertension: I10

## 2024-01-28 HISTORY — DX: Hypothyroidism, unspecified: E03.9

## 2024-01-28 HISTORY — PX: POLYPECTOMY: SHX149

## 2024-01-28 HISTORY — PX: COLONOSCOPY: SHX5424

## 2024-01-28 LAB — GLUCOSE, CAPILLARY: Glucose-Capillary: 114 mg/dL — ABNORMAL HIGH (ref 70–99)

## 2024-01-28 SURGERY — COLONOSCOPY
Anesthesia: Monitor Anesthesia Care

## 2024-01-28 MED ORDER — PROPOFOL 1000 MG/100ML IV EMUL
INTRAVENOUS | Status: AC
  Filled 2024-01-28: qty 100

## 2024-01-28 MED ORDER — LIDOCAINE 2% (20 MG/ML) 5 ML SYRINGE
INTRAMUSCULAR | Status: DC | PRN
  Administered 2024-01-28: 60 mg via INTRAVENOUS

## 2024-01-28 MED ORDER — PROPOFOL 500 MG/50ML IV EMUL
INTRAVENOUS | Status: DC | PRN
  Administered 2024-01-28: 70 mg via INTRAVENOUS
  Administered 2024-01-28: 20 mg via INTRAVENOUS
  Administered 2024-01-28: 30 mg via INTRAVENOUS
  Administered 2024-01-28: 125 ug/kg/min via INTRAVENOUS
  Administered 2024-01-28: 20 mg via INTRAVENOUS

## 2024-01-28 MED ORDER — SODIUM CHLORIDE 0.9 % IV SOLN: INTRAVENOUS | Status: DC

## 2024-01-28 MED ORDER — PROPOFOL 500 MG/50ML IV EMUL
INTRAVENOUS | Status: AC
  Filled 2024-01-28: qty 50

## 2024-01-28 MED ORDER — PROPOFOL 1000 MG/100ML IV EMUL
INTRAVENOUS | Status: AC
Start: 2024-01-28 — End: 2024-01-28
  Filled 2024-01-28: qty 100

## 2024-01-28 NOTE — Discharge Instructions (Signed)

## 2024-01-28 NOTE — Anesthesia Preprocedure Evaluation (Addendum)
 Anesthesia Evaluation  Patient identified by MRN, date of birth, ID band Patient awake    Reviewed: Allergy & Precautions, NPO status , Patient's Chart, lab work & pertinent test results  Airway Mallampati: II  TM Distance: >3 FB Neck ROM: Full    Dental  (+) Partial Lower   Pulmonary neg pulmonary ROS   Pulmonary exam normal        Cardiovascular hypertension, Pt. on medications Normal cardiovascular exam     Neuro/Psych  PSYCHIATRIC DISORDERS  Depression     Neuromuscular disease    GI/Hepatic Neg liver ROS, Bowel prep,,,  Endo/Other  diabetes, Oral Hypoglycemic AgentsHypothyroidism  Class 3 obesity  Renal/GU negative Renal ROS     Musculoskeletal negative musculoskeletal ROS (+)    Abdominal  (+) + obese  Peds  Hematology negative hematology ROS (+)   Anesthesia Other Findings positive FIT  Reproductive/Obstetrics                              Anesthesia Physical Anesthesia Plan  ASA: 3  Anesthesia Plan: MAC   Post-op Pain Management:    Induction:   PONV Risk Score and Plan: 2 and Propofol  infusion and Treatment may vary due to age or medical condition  Airway Management Planned: Simple Face Mask  Additional Equipment:   Intra-op Plan:   Post-operative Plan:   Informed Consent: I have reviewed the patients History and Physical, chart, labs and discussed the procedure including the risks, benefits and alternatives for the proposed anesthesia with the patient or authorized representative who has indicated his/her understanding and acceptance.     Interpreter used for interview  Plan Discussed with: CRNA  Anesthesia Plan Comments:          Anesthesia Quick Evaluation

## 2024-01-28 NOTE — H&P (Signed)
 [facility] H[facility]  Subjective: Candace Rodgers is a 58 y.o. female who presents for positive FIT testing. No prior colonoscopy. Family history colon cancer in grandfather. No blood per rectum. No unintentional weight loss. No chest pain or shortness of breath.    Objective: General: Awake and alert, non-toxic in appearance Cardio: Regular rate and rhythm  Pulm: Clear to auscultation, no conversational dyspnea Abdomen: Soft, non-tender to palpation, bowel sounds appreciated    Assessment:  Positive FIT test  Plan:  -Recommend colonoscopy. Discussed benefits, alternatives and risks of procedure with patient including bleeding, infection, perforation, missed lesion, anesthesia, she verbalized understanding and elected to proceed -Further recommendations to follow pending procedure  Estefana Keas, DO [facility]

## 2024-01-28 NOTE — Anesthesia Procedure Notes (Signed)
 Date/Time: 01/28/2024 7:34 AM  Performed by: Para Jerelene CROME, CRNAOxygen Delivery Method: Nasal cannula

## 2024-01-28 NOTE — Op Note (Signed)
 [facility] Patient Name: Candace Rodgers Procedure Date: 01/28/2024 MRN: [id number] Attending MD: Estefana Keas DO, DO, [id number] Date of Birth: [birth year] CSN: [id number] Age: 58 Admit Type: Outpatient Procedure:                Colonoscopy Indications:              Positive fecal immunochemical test Providers:                Estefana Keas DO, DO, Collene Edu, RN, Corky Czech, Technician Referring MD:              Medicines:                See the Anesthesia note for documentation of the                            administered medications Complications:            No immediate complications. Estimated Blood Loss:     Estimated blood loss was minimal. Procedure:                Pre-Anesthesia Assessment:                           - ASA Grade Assessment: III - A patient with severe                            systemic disease.                           - The risks and benefits of the procedure and the                            sedation options and risks were discussed with the                            patient. All questions were answered and informed                            consent was obtained.                           After obtaining informed consent, the colonoscope                            was passed under direct vision. Throughout the                            procedure, the patient's blood pressure, pulse, and                            oxygen saturations were monitored continuously. The                            [id number]-[id number] ([id number] Olympus colonscope was  introduced through the anus and advanced to the the                            terminal ileum, with identification of the                            appendiceal orifice and IC valve. The colonoscopy                            was performed without difficulty. The patient                            tolerated the procedure well. The  quality of the                            bowel preparation was evaluated using the BBPS                            [facility] Bowel Preparation Scale) with scores of:                            Right Colon = 3, Transverse Colon = 3 and Left                            Colon = 3 (entire mucosa seen well with no residual                            staining, small fragments of stool or opaque                            liquid). The total BBPS score equals 9. The                            terminal ileum, ileocecal valve, appendiceal                            orifice, and rectum were photographed. Scope In: 7:38:17 AM Scope Out: 8:07:29 AM Scope Withdrawal Time: 0 hours 23 minutes 34 seconds  Total Procedure Duration: 0 hours 29 minutes 12 seconds  Findings:      The digital rectal exam findings include non-thrombosed external       hemorrhoids.      Two sessile polyps were found in the ascending colon. The polyps were 3       to 4 mm in size. These polyps were removed with a cold snare. Resection       and retrieval were complete.      Multiple medium-mouthed and small-mouthed diverticula were found in the       sigmoid colon and ascending colon.      The terminal ileum appeared normal. Impression:               - Non-thrombosed external hemorrhoids found on  digital rectal exam.                           - Two 3 to 4 mm polyps in the ascending colon,                            removed with a cold snare. Resected and retrieved.                           - Diverticulosis in the sigmoid colon.                           - The examined portion of the ileum was normal. Moderate Sedation:      Monitored anesthesia care provided by anesthesia department. Recommendation:           - Discharge patient to home.                           - High fiber diet.                           - Continue present medications.                           - Await pathology results.                            - Repeat colonoscopy date to be determined after                            pending pathology results are reviewed for                            surveillance based on pathology results.                           - Return to GI office PRN.                           - Telephone GI office if symptomatic PRN.                           - Telephone GI office for pathology results in 3                            weeks. Procedure Code(s):        --- Professional ---                           [phone], Colonoscopy, flexible; with removal of                            tumor(s), polyp(s), or other lesion(s) by snare                            technique Diagnosis Code(s):        ---  Professional ---                           D12.2, Benign neoplasm of ascending colon                           K64.4, Residual hemorrhoidal skin tags                           R19.5, Other fecal abnormalities                           K57.30, Diverticulosis of large intestine without                            perforation or abscess without bleeding CPT copyright 2022 [employer]. All rights reserved. The codes documented in this report are preliminary and upon coder review may  be revised to meet current compliance requirements. Dr Estefana Keas, DO Estefana Keas DO, DO 01/28/2024 8:20:59 AM Number of Addenda: 0

## 2024-01-28 NOTE — Transfer of Care (Addendum)
 Immediate Anesthesia Transfer of Care Note  Patient: Candace Rodgers  Procedure(s) Performed: COLONOSCOPY POLYPECTOMY, INTESTINE  Patient Location: Endoscopy Unit  Anesthesia Type:MAC  Level of Consciousness: drowsy and patient cooperative  Airway & Oxygen Therapy: Patient Spontanous Breathing and Patient connected to nasal cannula oxygen  Post-op Assessment: Report given to RN and Post -op Vital signs reviewed and stable  Post vital signs: Reviewed and stable  Last Vitals:  Vitals Value Taken Time  BP 117/66 01/28/24 08:15  Temp 36.4 01/28/24   08:15  Pulse 55 01/28/24 08:20  Resp 26 01/28/24 08:20  SpO2 99 % 01/28/24 08:20  Vitals shown include unfiled device data.  Last Pain:  Vitals:   01/28/24 0654  TempSrc: Temporal  PainSc: 0-No pain      Patients Stated Pain Goal: 0 (01/28/24 0654)  Complications: No notable events documented.

## 2024-01-29 LAB — SURGICAL PATHOLOGY

## 2024-01-29 NOTE — Anesthesia Postprocedure Evaluation (Signed)
 Anesthesia Post Note  Patient: Candace Rodgers  Procedure(s) Performed: COLONOSCOPY POLYPECTOMY, INTESTINE     Patient location during evaluation: Endoscopy Anesthesia Type: MAC Level of consciousness: awake Pain management: pain level controlled Vital Signs Assessment: post-procedure vital signs reviewed and stable Respiratory status: spontaneous breathing, nonlabored ventilation and respiratory function stable Cardiovascular status: blood pressure returned to baseline and stable Postop Assessment: no apparent nausea or vomiting Anesthetic complications: no   No notable events documented.  Last Vitals:  Vitals:   01/28/24 0830 01/28/24 0835  BP: (!) 160/71   Pulse: (!) 55 (!) 53  Resp: 14 19  Temp:    SpO2: 99% 100%    Last Pain:  Vitals:   01/28/24 0830  TempSrc:   PainSc: 0-No pain                 Sherine Cortese P Cimberly Stoffel

## 2024-01-30 ENCOUNTER — Encounter (HOSPITAL_COMMUNITY): Payer: Self-pay | Admitting: Internal Medicine
